# Patient Record
Sex: Female | Born: 1945 | Race: White | Hispanic: No | Marital: Married | Smoking: Never smoker
Health system: Southern US, Community
[De-identification: ages and names within clinical notes are randomized; demographics above are authoritative.]

## PROBLEM LIST (undated history)

## (undated) DIAGNOSIS — M810 Age-related osteoporosis without current pathological fracture: Secondary | ICD-10-CM

## (undated) DIAGNOSIS — R519 Headache, unspecified: Secondary | ICD-10-CM

## (undated) DIAGNOSIS — K5792 Diverticulitis of intestine, part unspecified, without perforation or abscess without bleeding: Secondary | ICD-10-CM

## (undated) DIAGNOSIS — K589 Irritable bowel syndrome without diarrhea: Secondary | ICD-10-CM

## (undated) HISTORY — PX: TONSILLECTOMY AND ADENOIDECTOMY: SUR1326

## (undated) HISTORY — PX: BREAST BIOPSY: SHX20

---

## 1998-04-27 ENCOUNTER — Other Ambulatory Visit: Admission: RE | Admit: 1998-04-27 | Discharge: 1998-04-27 | Payer: Self-pay | Admitting: Obstetrics and Gynecology

## 1999-06-07 ENCOUNTER — Encounter: Payer: Self-pay | Admitting: Obstetrics and Gynecology

## 1999-06-07 ENCOUNTER — Encounter: Admission: RE | Admit: 1999-06-07 | Discharge: 1999-06-07 | Payer: Self-pay | Admitting: Obstetrics and Gynecology

## 2000-06-26 ENCOUNTER — Encounter: Payer: Self-pay | Admitting: Family Medicine

## 2000-06-26 ENCOUNTER — Encounter: Admission: RE | Admit: 2000-06-26 | Discharge: 2000-06-26 | Payer: Self-pay | Admitting: Family Medicine

## 2001-04-02 ENCOUNTER — Encounter: Admission: RE | Admit: 2001-04-02 | Discharge: 2001-04-02 | Payer: Self-pay | Admitting: Family Medicine

## 2001-04-02 ENCOUNTER — Encounter: Payer: Self-pay | Admitting: Family Medicine

## 2001-07-01 ENCOUNTER — Encounter: Admission: RE | Admit: 2001-07-01 | Discharge: 2001-07-01 | Payer: Self-pay | Admitting: Family Medicine

## 2001-07-01 ENCOUNTER — Encounter: Payer: Self-pay | Admitting: Family Medicine

## 2003-03-17 ENCOUNTER — Encounter: Admission: RE | Admit: 2003-03-17 | Discharge: 2003-03-17 | Payer: Self-pay | Admitting: Family Medicine

## 2003-03-17 ENCOUNTER — Encounter: Payer: Self-pay | Admitting: Family Medicine

## 2004-03-21 ENCOUNTER — Ambulatory Visit (HOSPITAL_COMMUNITY): Admission: RE | Admit: 2004-03-21 | Discharge: 2004-03-21 | Payer: Self-pay | Admitting: Family Medicine

## 2004-07-26 ENCOUNTER — Other Ambulatory Visit: Admission: RE | Admit: 2004-07-26 | Discharge: 2004-07-26 | Payer: Self-pay | Admitting: Family Medicine

## 2005-04-11 ENCOUNTER — Encounter: Admission: RE | Admit: 2005-04-11 | Discharge: 2005-04-11 | Payer: Self-pay | Admitting: Family Medicine

## 2005-08-09 ENCOUNTER — Other Ambulatory Visit: Admission: RE | Admit: 2005-08-09 | Discharge: 2005-08-09 | Payer: Self-pay | Admitting: Family Medicine

## 2006-05-03 ENCOUNTER — Encounter: Admission: RE | Admit: 2006-05-03 | Discharge: 2006-05-03 | Payer: Self-pay | Admitting: Family Medicine

## 2007-02-01 ENCOUNTER — Other Ambulatory Visit: Admission: RE | Admit: 2007-02-01 | Discharge: 2007-02-01 | Payer: Self-pay | Admitting: Family Medicine

## 2007-05-09 ENCOUNTER — Encounter: Admission: RE | Admit: 2007-05-09 | Discharge: 2007-05-09 | Payer: Self-pay | Admitting: Family Medicine

## 2008-05-11 ENCOUNTER — Encounter: Admission: RE | Admit: 2008-05-11 | Discharge: 2008-05-11 | Payer: Self-pay | Admitting: Internal Medicine

## 2009-08-12 ENCOUNTER — Encounter: Admission: RE | Admit: 2009-08-12 | Discharge: 2009-08-12 | Payer: Self-pay | Admitting: Internal Medicine

## 2010-07-14 ENCOUNTER — Other Ambulatory Visit: Payer: Self-pay | Admitting: Internal Medicine

## 2010-07-14 DIAGNOSIS — Z1231 Encounter for screening mammogram for malignant neoplasm of breast: Secondary | ICD-10-CM

## 2010-08-15 ENCOUNTER — Ambulatory Visit
Admission: RE | Admit: 2010-08-15 | Discharge: 2010-08-15 | Disposition: A | Discharge status: Home / Self Care | Payer: Self-pay | Source: Ambulatory Visit | Attending: Internal Medicine | Admitting: Internal Medicine

## 2010-08-15 DIAGNOSIS — Z1231 Encounter for screening mammogram for malignant neoplasm of breast: Secondary | ICD-10-CM

## 2011-08-03 ENCOUNTER — Other Ambulatory Visit: Payer: Self-pay | Admitting: Internal Medicine

## 2011-08-03 DIAGNOSIS — Z1231 Encounter for screening mammogram for malignant neoplasm of breast: Secondary | ICD-10-CM

## 2011-08-16 ENCOUNTER — Ambulatory Visit
Admission: RE | Admit: 2011-08-16 | Discharge: 2011-08-16 | Disposition: A | Discharge status: Home / Self Care | Payer: BC Managed Care – PPO | Source: Ambulatory Visit | Attending: Internal Medicine | Admitting: Internal Medicine

## 2011-08-16 DIAGNOSIS — Z1231 Encounter for screening mammogram for malignant neoplasm of breast: Secondary | ICD-10-CM

## 2012-07-24 ENCOUNTER — Other Ambulatory Visit: Payer: Self-pay | Admitting: Internal Medicine

## 2012-08-19 ENCOUNTER — Ambulatory Visit
Admission: RE | Admit: 2012-08-19 | Discharge: 2012-08-19 | Disposition: A | Discharge status: Home / Self Care | Payer: BC Managed Care – PPO | Source: Ambulatory Visit | Attending: Internal Medicine | Admitting: Internal Medicine

## 2012-09-26 ENCOUNTER — Ambulatory Visit (INDEPENDENT_AMBULATORY_CARE_PROVIDER_SITE_OTHER): Payer: BC Managed Care – PPO | Admitting: Women's Health

## 2012-09-26 ENCOUNTER — Encounter: Payer: Self-pay | Admitting: Women's Health

## 2012-09-26 ENCOUNTER — Ambulatory Visit (INDEPENDENT_AMBULATORY_CARE_PROVIDER_SITE_OTHER): Payer: BC Managed Care – PPO

## 2012-09-26 VITALS — BP 120/76

## 2012-09-26 DIAGNOSIS — D251 Intramural leiomyoma of uterus: Secondary | ICD-10-CM

## 2012-09-26 DIAGNOSIS — Z780 Asymptomatic menopausal state: Secondary | ICD-10-CM

## 2012-09-26 DIAGNOSIS — R140 Abdominal distension (gaseous): Secondary | ICD-10-CM

## 2012-09-26 DIAGNOSIS — N959 Unspecified menopausal and perimenopausal disorder: Secondary | ICD-10-CM

## 2012-09-26 DIAGNOSIS — R142 Eructation: Secondary | ICD-10-CM

## 2012-09-26 DIAGNOSIS — R141 Gas pain: Secondary | ICD-10-CM

## 2012-09-26 DIAGNOSIS — D259 Leiomyoma of uterus, unspecified: Secondary | ICD-10-CM

## 2012-09-26 DIAGNOSIS — N898 Other specified noninflammatory disorders of vagina: Secondary | ICD-10-CM

## 2012-09-26 LAB — WET PREP FOR TRICH, YEAST, CLUE: Trich, Wet Prep: NONE SEEN

## 2012-09-26 MED ORDER — FLUCONAZOLE 150 MG PO TABS: 150.0000 mg | ORAL_TABLET | Freq: Once | ORAL | Status: DC

## 2012-09-26 NOTE — Progress Notes (Signed)
Patient ID: Julia Singleton, female   DOB: 07/11/1945, 67 y.o.   MRN: [id number] Presents with complaint of discharge and abdominal bloating discomfort.  09/09/12 noted a greenish discharge, no odor, vaginal itching or fever. Had a blood-tinged discharge on 09/14/12, has seen primary care was treated for vaginal infection with doxycycline with minimal relief. Had a negative UA x2 at primary care. Denies urinary symptoms. Continues with low abdominal discomfort with bloating, occasional discharge with green tint. Reports having GI symptoms in the past. Reports normal Paps and mammograms, done through primary care.  Exam: Appears well, no CVAT, abdomen soft no rebound or radiation of pain, external genitalia erythematous at introitus, speculum exam scant discharge vaginal atrophy noted, wet prep positive for yeast,  moderate WBCs. Bimanual no CMT or adnexal tenderness or fullness noted. Ultrasound: anteverted uterus with anterior fundal intramural fibroid 19 x 20 mm. Endometrium 1.4 mm avascular. Right and left ovary normal. Echo pattern atrophic. Negative cul-de-sac. No adnexal masses.  Yeast vaginitis Abdominal bloating with normal pelvic ultrasound  Plan: Diflucan 150 by mouth times one dose. Encouraged to review diet for possible triggers of bloating sensation. Encouraged regular daily walking. Followup with primary care if abdominal bloating/symptoms persist.

## 2012-10-09 ENCOUNTER — Telehealth: Payer: Self-pay | Admitting: *Deleted

## 2012-10-09 NOTE — Telephone Encounter (Signed)
Pt calling to follow up with OV on 09/26/12 she is still having discharge that comes and goes. More discharge in the afternoon then am. No itching or odor. Please advise

## 2012-10-09 NOTE — Telephone Encounter (Signed)
Please call, review discharge is okay, ask if any irritation, odor, itching. If not okay to watch.

## 2012-10-09 NOTE — Telephone Encounter (Signed)
Pt informed with the below note. She will follow up if needed. 

## 2013-04-29 ENCOUNTER — Emergency Department (HOSPITAL_COMMUNITY)
Admission: EM | Admit: 2013-04-29 | Discharge: 2013-04-29 | Disposition: A | Discharge status: Home / Self Care | Payer: Medicare PPO | Attending: Emergency Medicine | Admitting: Emergency Medicine

## 2013-04-29 ENCOUNTER — Encounter (HOSPITAL_COMMUNITY): Payer: Self-pay | Admitting: Emergency Medicine

## 2013-04-29 ENCOUNTER — Emergency Department (HOSPITAL_COMMUNITY): Payer: Medicare PPO

## 2013-04-29 DIAGNOSIS — R109 Unspecified abdominal pain: Secondary | ICD-10-CM

## 2013-04-29 DIAGNOSIS — R1032 Left lower quadrant pain: Secondary | ICD-10-CM | POA: Insufficient documentation

## 2013-04-29 DIAGNOSIS — R110 Nausea: Secondary | ICD-10-CM | POA: Insufficient documentation

## 2013-04-29 DIAGNOSIS — M810 Age-related osteoporosis without current pathological fracture: Secondary | ICD-10-CM | POA: Insufficient documentation

## 2013-04-29 DIAGNOSIS — Z880 Allergy status to penicillin: Secondary | ICD-10-CM | POA: Insufficient documentation

## 2013-04-29 DIAGNOSIS — K5732 Diverticulitis of large intestine without perforation or abscess without bleeding: Secondary | ICD-10-CM | POA: Insufficient documentation

## 2013-04-29 HISTORY — DX: Diverticulitis of intestine, part unspecified, without perforation or abscess without bleeding: K57.92

## 2013-04-29 HISTORY — DX: Age-related osteoporosis without current pathological fracture: M81.0

## 2013-04-29 LAB — URINE MICROSCOPIC-ADD ON

## 2013-04-29 LAB — CBC WITH DIFFERENTIAL/PLATELET
Basophils Absolute: 0 10*3/uL (ref 0.0–0.1)
Basophils Relative: 0 % (ref 0–1)
Eosinophils Absolute: 0.1 10*3/uL (ref 0.0–0.7)
HCT: 39 % (ref 36.0–46.0)
MCH: 30.7 pg (ref 26.0–34.0)
MCHC: 35.1 g/dL (ref 30.0–36.0)
MCV: 87.4 fL (ref 78.0–100.0)
Monocytes Absolute: 0.9 10*3/uL (ref 0.1–1.0)
Monocytes Relative: 8 % (ref 3–12)
Neutrophils Relative %: 75 % (ref 43–77)
Platelets: 282 10*3/uL (ref 150–400)

## 2013-04-29 LAB — URINALYSIS, ROUTINE W REFLEX MICROSCOPIC
Leukocytes, UA: NEGATIVE
Specific Gravity, Urine: 1.013 (ref 1.005–1.030)
Urobilinogen, UA: 0.2 mg/dL (ref 0.0–1.0)

## 2013-04-29 LAB — BASIC METABOLIC PANEL
CO2: 25 mEq/L (ref 19–32)
GFR calc non Af Amer: >90 mL/min (ref >90)
Glucose, Bld: 118 mg/dL — ABNORMAL HIGH (ref 70–99)
Potassium: 3.4 mEq/L — ABNORMAL LOW (ref 3.5–5.1)

## 2013-04-29 MED ORDER — IOHEXOL 300 MG/ML  SOLN
50.0000 mL | Freq: Once | INTRAMUSCULAR | Status: AC | PRN
  Administered 2013-04-29: 50 mL via ORAL

## 2013-04-29 MED ORDER — HYDROCODONE-ACETAMINOPHEN 5-325 MG PO TABS: 1.0000 | ORAL_TABLET | Freq: Four times a day (QID) | ORAL | Status: DC | PRN

## 2013-04-29 MED ORDER — CIPROFLOXACIN HCL 500 MG PO TABS: 500.0000 mg | ORAL_TABLET | Freq: Two times a day (BID) | ORAL | Status: DC

## 2013-04-29 MED ORDER — METRONIDAZOLE 500 MG PO TABS: 500.0000 mg | ORAL_TABLET | Freq: Two times a day (BID) | ORAL | Status: DC

## 2013-04-29 MED ORDER — ONDANSETRON 8 MG PO TBDP: 8.0000 mg | ORAL_TABLET | Freq: Three times a day (TID) | ORAL | Status: DC | PRN

## 2013-04-29 MED ORDER — MORPHINE SULFATE 4 MG/ML IJ SOLN: 4.0000 mg | Freq: Once | INTRAMUSCULAR | Status: DC

## 2013-04-29 MED ORDER — IOHEXOL 300 MG/ML  SOLN
100.0000 mL | Freq: Once | INTRAMUSCULAR | Status: AC | PRN
  Administered 2013-04-29: 100 mL via INTRAVENOUS

## 2013-04-29 MED ORDER — SODIUM CHLORIDE 0.9 % IV BOLUS (SEPSIS)
1000.0000 mL | Freq: Once | INTRAVENOUS | Status: AC
  Administered 2013-04-29: 1000 mL via INTRAVENOUS

## 2013-04-29 MED ORDER — ONDANSETRON HCL 4 MG/2ML IJ SOLN: 4.0000 mg | Freq: Once | INTRAMUSCULAR | Status: DC

## 2013-04-29 NOTE — ED Notes (Signed)
Pt complains of severe abd pains for one week, no vomiting or diarrhea but feels nauseated

## 2013-04-29 NOTE — ED Provider Notes (Signed)
CSN: [id number]     Arrival date & time 04/29/13  6213 History   First MD Initiated Contact with Patient 04/29/13 0247     Chief Complaint  Patient presents with  . Abdominal Pain   (Consider location/radiation/quality/duration/timing/severity/associated sxs/prior Treatment) HPI Comments: Pt comes in with cc of abd pain. Pt has hx of diverticular disease, and some obstruction. She reports that for the past 1 week, she has been having constant, crampy type pain, diffuse, with intermittent worsening. The pain is non radiating. Pain started getting more severe y'day, so she decided to come to the ER. Pt has no uti like sx, no bloody stools, no hx of kidney stones. No vaginal discharge or bleeding. Off note, pt has no ibd hx, but did say that her stools are slightly mucousy.  Patient is a 67 y.o. female presenting with abdominal pain. The history is provided by the patient.  Abdominal Pain Associated symptoms: nausea   Associated symptoms: no chest pain, no diarrhea, no dysuria, no shortness of breath and no vomiting     Past Medical History  Diagnosis Date  . Osteoporosis   . Diverticulitis    History reviewed. No pertinent past surgical history. History reviewed. No pertinent family history. History  Substance Use Topics  . Smoking status: Never Smoker   . Smokeless tobacco: Not on file  . Alcohol Use: Yes     Comment: Rare   OB History   Grav Para Term Preterm Abortions TAB SAB Ect Mult Living                 Review of Systems  Constitutional: Positive for activity change.  Respiratory: Negative for shortness of breath.   Cardiovascular: Negative for chest pain.  Gastrointestinal: Positive for nausea and abdominal pain. Negative for vomiting and diarrhea.  Genitourinary: Negative for dysuria.  Musculoskeletal: Negative for neck pain.  Neurological: Negative for headaches.    Allergies  Penicillins and Pneumococcal vaccines  Home Medications   Current Outpatient Rx   Name  Route  Sig  Dispense  Refill  . calcium carbonate (TUMS - DOSED IN MG ELEMENTAL CALCIUM) 500 MG chewable tablet   Oral   Chew 1 tablet by mouth every morning.         . cholecalciferol (VITAMIN D) 1000 UNITS tablet   Oral   Take 2,000 Units by mouth every morning.         . Multiple Vitamin (MULTIVITAMIN) tablet   Oral   Take 1 tablet by mouth every morning.          . raloxifene (EVISTA) 60 MG tablet   Oral   Take 60 mg by mouth every morning.           BP 132/69  Pulse 75  Temp(Src) 98.7 F (37.1 C) (Oral)  Resp 18  Ht 5\' 6"  (1.676 m)  Wt 125 lb (56.7 kg)  BMI 20.19 kg/m2  SpO2 98% Physical Exam  Nursing note and vitals reviewed. Constitutional: She is oriented to person, place, and time. She appears well-developed and well-nourished.  HENT:  Head: Normocephalic and atraumatic.  Eyes: EOM are normal. Pupils are equal, round, and reactive to light.  Neck: Neck supple.  Cardiovascular: Normal rate, regular rhythm and normal heart sounds.   No murmur heard. Pulmonary/Chest: Effort normal. No respiratory distress.  Abdominal: Soft. She exhibits no distension. There is tenderness. There is no rebound and no guarding.  Lower quadrant tenderness, including LLQ point tenderness  Neurological: She is  alert and oriented to person, place, and time.  Skin: Skin is warm and dry.    ED Course  Procedures (including critical care time) Labs Review Labs Reviewed  CBC WITH DIFFERENTIAL - Abnormal; Notable for the following:    WBC 11.1 (*)    Neutro Abs 8.4 (*)    All other components within normal limits  BASIC METABOLIC PANEL - Abnormal; Notable for the following:    Sodium 131 (*)    Potassium 3.4 (*)    Chloride 94 (*)    Glucose, Bld 118 (*)    All other components within normal limits  URINALYSIS, ROUTINE W REFLEX MICROSCOPIC - Abnormal; Notable for the following:    Hgb urine dipstick SMALL (*)    All other components within normal limits  URINE  MICROSCOPIC-ADD ON   Imaging Review Ct Abdomen Pelvis W Contrast  04/29/2013   CLINICAL DATA:  Severe abdominal pain and nausea for 1 week. White cell count 11.1.  EXAM: CT ABDOMEN AND PELVIS WITH CONTRAST  TECHNIQUE: Multidetector CT imaging of the abdomen and pelvis was performed using the standard protocol following bolus administration of intravenous contrast.  CONTRAST:  OMNIPAQUE IOHEXOL 300 MG/ML  SOLN  COMPARISON:  None.  FINDINGS: The lung bases are clear.  There is probably a small epiphrenic diverticulum in the lower esophagus. There is low-attenuation lesion in the anterior spleen, measuring about 6 mm diameter. This probably represents a cyst or hemangioma. The liver, gallbladder, pancreas, adrenal glands, kidneys, abdominal aorta, inferior vena cava, and retroperitoneal lymph nodes are unremarkable. The stomach and small bowel are not abnormally distended. Diffusely stool-filled colon without distention. No free fluid or free air in the abdomen.  Pelvis: The appendix is normal. Stool-filled rectosigmoid colon with multiple sigmoid diverticula. No definite inflammatory changes to suggest diverticulitis. There is a small amount of free fluid in the pelvis which is of nonspecific etiology. The uterus and ovaries are not enlarged. No significant pelvic lymphadenopathy. The bladder is decompressed without apparent wall thickening. Degenerative changes in the lumbar spine. No destructive bone lesions.  IMPRESSION: Small amount of free fluid in the pelvis is of indeterminate etiology. Inflammatory process is not excluded. Diverticulosis of the sigmoid colon without evidence of diverticulitis. The appendix is normal. The colon is diffusely stool-filled suggesting constipation.   Electronically Signed   By: Burman Nieves M.D.   On: 04/29/2013 06:18    EKG Interpretation   None       MDM  No diagnosis found. Pt comes in with cc of abd pain. Pt has hx of diverticular dz. Has mild  leukocytosis. Pt has no uti like sx. We ordered a CT - as she had LLQ abd pain - which shows non specific pelvic fluid, no diverticulitis.  I discussed the findings with the patient, and we have decided to start her on cipro and flagyl. Pt prefers that over going home with pain meds, as her sx have been going on for a week now.  i spoke with [facility] medical doctor on call, and established an appt for 9 am.  If no infection, ddx can include IBD, celiac etc.   Derwood Kaplan, MD 04/29/13 [phone]

## 2013-07-17 ENCOUNTER — Other Ambulatory Visit: Payer: Self-pay

## 2013-07-17 DIAGNOSIS — Z1231 Encounter for screening mammogram for malignant neoplasm of breast: Secondary | ICD-10-CM

## 2013-08-21 ENCOUNTER — Ambulatory Visit
Admission: RE | Admit: 2013-08-21 | Discharge: 2013-08-21 | Disposition: A | Discharge status: Home / Self Care | Payer: Commercial Managed Care - HMO | Source: Ambulatory Visit

## 2013-08-21 DIAGNOSIS — Z1231 Encounter for screening mammogram for malignant neoplasm of breast: Secondary | ICD-10-CM

## 2014-03-20 ENCOUNTER — Other Ambulatory Visit: Payer: Self-pay

## 2014-07-20 DIAGNOSIS — H9212 Otorrhea, left ear: Secondary | ICD-10-CM | POA: Diagnosis not present

## 2014-07-20 DIAGNOSIS — Z6820 Body mass index (BMI) 20.0-20.9, adult: Secondary | ICD-10-CM | POA: Diagnosis not present

## 2014-08-10 ENCOUNTER — Other Ambulatory Visit: Payer: Self-pay

## 2014-08-10 DIAGNOSIS — Z1231 Encounter for screening mammogram for malignant neoplasm of breast: Secondary | ICD-10-CM

## 2014-08-24 ENCOUNTER — Ambulatory Visit
Admission: RE | Admit: 2014-08-24 | Discharge: 2014-08-24 | Disposition: A | Discharge status: Home / Self Care | Payer: Commercial Managed Care - HMO | Source: Ambulatory Visit

## 2014-08-24 DIAGNOSIS — Z1231 Encounter for screening mammogram for malignant neoplasm of breast: Secondary | ICD-10-CM | POA: Diagnosis not present

## 2014-09-08 DIAGNOSIS — Z008 Encounter for other general examination: Secondary | ICD-10-CM | POA: Diagnosis not present

## 2014-09-08 DIAGNOSIS — M859 Disorder of bone density and structure, unspecified: Secondary | ICD-10-CM | POA: Diagnosis not present

## 2014-09-08 DIAGNOSIS — R946 Abnormal results of thyroid function studies: Secondary | ICD-10-CM | POA: Diagnosis not present

## 2014-09-08 DIAGNOSIS — R312 Other microscopic hematuria: Secondary | ICD-10-CM | POA: Diagnosis not present

## 2014-09-15 DIAGNOSIS — K5900 Constipation, unspecified: Secondary | ICD-10-CM | POA: Diagnosis not present

## 2014-09-15 DIAGNOSIS — K5790 Diverticulosis of intestine, part unspecified, without perforation or abscess without bleeding: Secondary | ICD-10-CM | POA: Diagnosis not present

## 2014-09-15 DIAGNOSIS — Z0000 Encounter for general adult medical examination without abnormal findings: Secondary | ICD-10-CM | POA: Diagnosis not present

## 2014-09-15 DIAGNOSIS — H1852 Epithelial (juvenile) corneal dystrophy: Secondary | ICD-10-CM | POA: Diagnosis not present

## 2014-09-15 DIAGNOSIS — G4700 Insomnia, unspecified: Secondary | ICD-10-CM | POA: Diagnosis not present

## 2014-09-15 DIAGNOSIS — J302 Other seasonal allergic rhinitis: Secondary | ICD-10-CM | POA: Diagnosis not present

## 2014-09-15 DIAGNOSIS — R946 Abnormal results of thyroid function studies: Secondary | ICD-10-CM | POA: Diagnosis not present

## 2014-09-15 DIAGNOSIS — F329 Major depressive disorder, single episode, unspecified: Secondary | ICD-10-CM | POA: Diagnosis not present

## 2014-10-07 DIAGNOSIS — M898X9 Other specified disorders of bone, unspecified site: Secondary | ICD-10-CM | POA: Diagnosis not present

## 2014-10-07 DIAGNOSIS — M859 Disorder of bone density and structure, unspecified: Secondary | ICD-10-CM | POA: Diagnosis not present

## 2014-10-26 DIAGNOSIS — Z1212 Encounter for screening for malignant neoplasm of rectum: Secondary | ICD-10-CM | POA: Diagnosis not present

## 2014-11-17 DIAGNOSIS — H5213 Myopia, bilateral: Secondary | ICD-10-CM | POA: Diagnosis not present

## 2014-11-17 DIAGNOSIS — H5210 Myopia, unspecified eye: Secondary | ICD-10-CM | POA: Diagnosis not present

## 2014-11-30 ENCOUNTER — Other Ambulatory Visit: Payer: Self-pay

## 2015-03-06 DIAGNOSIS — Z23 Encounter for immunization: Secondary | ICD-10-CM | POA: Diagnosis not present

## 2015-03-11 DIAGNOSIS — H9190 Unspecified hearing loss, unspecified ear: Secondary | ICD-10-CM | POA: Diagnosis not present

## 2015-03-11 DIAGNOSIS — L299 Pruritus, unspecified: Secondary | ICD-10-CM | POA: Diagnosis not present

## 2015-04-12 DIAGNOSIS — H9193 Unspecified hearing loss, bilateral: Secondary | ICD-10-CM | POA: Diagnosis not present

## 2015-04-12 DIAGNOSIS — Z974 Presence of external hearing-aid: Secondary | ICD-10-CM | POA: Diagnosis not present

## 2015-04-12 DIAGNOSIS — H608X3 Other otitis externa, bilateral: Secondary | ICD-10-CM | POA: Diagnosis not present

## 2015-07-28 ENCOUNTER — Other Ambulatory Visit: Payer: Self-pay

## 2015-07-28 DIAGNOSIS — Z1231 Encounter for screening mammogram for malignant neoplasm of breast: Secondary | ICD-10-CM

## 2015-08-12 DIAGNOSIS — Z86010 Personal history of colonic polyps: Secondary | ICD-10-CM | POA: Diagnosis not present

## 2015-08-12 DIAGNOSIS — K5730 Diverticulosis of large intestine without perforation or abscess without bleeding: Secondary | ICD-10-CM | POA: Diagnosis not present

## 2015-08-26 ENCOUNTER — Ambulatory Visit
Admission: RE | Admit: 2015-08-26 | Discharge: 2015-08-26 | Disposition: A | Discharge status: Home / Self Care | Payer: Commercial Managed Care - HMO | Source: Ambulatory Visit

## 2015-08-26 DIAGNOSIS — Z1231 Encounter for screening mammogram for malignant neoplasm of breast: Secondary | ICD-10-CM | POA: Diagnosis not present

## 2015-09-06 DIAGNOSIS — Z6820 Body mass index (BMI) 20.0-20.9, adult: Secondary | ICD-10-CM | POA: Diagnosis not present

## 2015-09-06 DIAGNOSIS — Z01411 Encounter for gynecological examination (general) (routine) with abnormal findings: Secondary | ICD-10-CM | POA: Diagnosis not present

## 2015-09-06 DIAGNOSIS — Z124 Encounter for screening for malignant neoplasm of cervix: Secondary | ICD-10-CM | POA: Diagnosis not present

## 2015-09-06 DIAGNOSIS — N9419 Other specified dyspareunia: Secondary | ICD-10-CM | POA: Diagnosis not present

## 2015-09-13 DIAGNOSIS — Z0000 Encounter for general adult medical examination without abnormal findings: Secondary | ICD-10-CM | POA: Diagnosis not present

## 2015-09-13 DIAGNOSIS — R8290 Unspecified abnormal findings in urine: Secondary | ICD-10-CM | POA: Diagnosis not present

## 2015-09-13 DIAGNOSIS — M859 Disorder of bone density and structure, unspecified: Secondary | ICD-10-CM | POA: Diagnosis not present

## 2015-09-20 DIAGNOSIS — R946 Abnormal results of thyroid function studies: Secondary | ICD-10-CM | POA: Diagnosis not present

## 2015-09-20 DIAGNOSIS — Z6820 Body mass index (BMI) 20.0-20.9, adult: Secondary | ICD-10-CM | POA: Diagnosis not present

## 2015-09-20 DIAGNOSIS — Z0000 Encounter for general adult medical examination without abnormal findings: Secondary | ICD-10-CM | POA: Diagnosis not present

## 2015-09-20 DIAGNOSIS — N951 Menopausal and female climacteric states: Secondary | ICD-10-CM | POA: Diagnosis not present

## 2015-09-20 DIAGNOSIS — Z1389 Encounter for screening for other disorder: Secondary | ICD-10-CM | POA: Diagnosis not present

## 2015-09-20 DIAGNOSIS — F329 Major depressive disorder, single episode, unspecified: Secondary | ICD-10-CM | POA: Diagnosis not present

## 2015-09-20 DIAGNOSIS — R0609 Other forms of dyspnea: Secondary | ICD-10-CM | POA: Diagnosis not present

## 2015-11-22 DIAGNOSIS — H5210 Myopia, unspecified eye: Secondary | ICD-10-CM | POA: Diagnosis not present

## 2015-11-22 DIAGNOSIS — H524 Presbyopia: Secondary | ICD-10-CM | POA: Diagnosis not present

## 2015-12-09 DIAGNOSIS — Z0100 Encounter for examination of eyes and vision without abnormal findings: Secondary | ICD-10-CM | POA: Diagnosis not present

## 2016-02-19 DIAGNOSIS — Z23 Encounter for immunization: Secondary | ICD-10-CM | POA: Diagnosis not present

## 2016-07-14 ENCOUNTER — Other Ambulatory Visit: Payer: Self-pay | Admitting: Internal Medicine

## 2016-07-14 DIAGNOSIS — Z1231 Encounter for screening mammogram for malignant neoplasm of breast: Secondary | ICD-10-CM

## 2016-08-28 ENCOUNTER — Ambulatory Visit
Admission: RE | Admit: 2016-08-28 | Discharge: 2016-08-28 | Disposition: A | Discharge status: Home / Self Care | Payer: Medicare HMO | Source: Ambulatory Visit | Attending: Internal Medicine | Admitting: Internal Medicine

## 2016-08-28 DIAGNOSIS — Z1231 Encounter for screening mammogram for malignant neoplasm of breast: Secondary | ICD-10-CM

## 2016-09-15 DIAGNOSIS — R946 Abnormal results of thyroid function studies: Secondary | ICD-10-CM | POA: Diagnosis not present

## 2016-09-15 DIAGNOSIS — R8299 Other abnormal findings in urine: Secondary | ICD-10-CM | POA: Diagnosis not present

## 2016-09-15 DIAGNOSIS — Z0000 Encounter for general adult medical examination without abnormal findings: Secondary | ICD-10-CM | POA: Diagnosis not present

## 2016-09-15 DIAGNOSIS — M859 Disorder of bone density and structure, unspecified: Secondary | ICD-10-CM | POA: Diagnosis not present

## 2016-09-21 DIAGNOSIS — Z1212 Encounter for screening for malignant neoplasm of rectum: Secondary | ICD-10-CM | POA: Diagnosis not present

## 2016-10-03 DIAGNOSIS — F341 Dysthymic disorder: Secondary | ICD-10-CM | POA: Diagnosis not present

## 2016-10-03 DIAGNOSIS — G4709 Other insomnia: Secondary | ICD-10-CM | POA: Diagnosis not present

## 2016-10-03 DIAGNOSIS — M859 Disorder of bone density and structure, unspecified: Secondary | ICD-10-CM | POA: Diagnosis not present

## 2016-10-03 DIAGNOSIS — K5909 Other constipation: Secondary | ICD-10-CM | POA: Diagnosis not present

## 2016-10-03 DIAGNOSIS — H1852 Epithelial (juvenile) corneal dystrophy: Secondary | ICD-10-CM | POA: Diagnosis not present

## 2016-10-03 DIAGNOSIS — L308 Other specified dermatitis: Secondary | ICD-10-CM | POA: Diagnosis not present

## 2016-10-03 DIAGNOSIS — J302 Other seasonal allergic rhinitis: Secondary | ICD-10-CM | POA: Diagnosis not present

## 2016-10-03 DIAGNOSIS — Z0000 Encounter for general adult medical examination without abnormal findings: Secondary | ICD-10-CM | POA: Diagnosis not present

## 2016-10-03 DIAGNOSIS — H9190 Unspecified hearing loss, unspecified ear: Secondary | ICD-10-CM | POA: Diagnosis not present

## 2016-11-02 DIAGNOSIS — M859 Disorder of bone density and structure, unspecified: Secondary | ICD-10-CM | POA: Diagnosis not present

## 2016-11-07 ENCOUNTER — Other Ambulatory Visit: Payer: Self-pay | Admitting: Internal Medicine

## 2016-11-07 DIAGNOSIS — R319 Hematuria, unspecified: Secondary | ICD-10-CM

## 2016-11-10 ENCOUNTER — Ambulatory Visit
Admission: RE | Admit: 2016-11-10 | Discharge: 2016-11-10 | Disposition: A | Discharge status: Home / Self Care | Payer: Medicare HMO | Source: Ambulatory Visit | Attending: Internal Medicine | Admitting: Internal Medicine

## 2016-11-10 DIAGNOSIS — R319 Hematuria, unspecified: Secondary | ICD-10-CM

## 2016-11-10 DIAGNOSIS — R310 Gross hematuria: Secondary | ICD-10-CM | POA: Diagnosis not present

## 2016-11-28 DIAGNOSIS — H524 Presbyopia: Secondary | ICD-10-CM | POA: Diagnosis not present

## 2017-01-08 DIAGNOSIS — R3129 Other microscopic hematuria: Secondary | ICD-10-CM | POA: Diagnosis not present

## 2017-02-19 DIAGNOSIS — R3121 Asymptomatic microscopic hematuria: Secondary | ICD-10-CM | POA: Diagnosis not present

## 2017-03-31 DIAGNOSIS — Z23 Encounter for immunization: Secondary | ICD-10-CM | POA: Diagnosis not present

## 2017-07-31 ENCOUNTER — Other Ambulatory Visit: Payer: Self-pay | Admitting: Internal Medicine

## 2017-07-31 DIAGNOSIS — Z1231 Encounter for screening mammogram for malignant neoplasm of breast: Secondary | ICD-10-CM

## 2017-09-05 ENCOUNTER — Ambulatory Visit
Admission: RE | Admit: 2017-09-05 | Discharge: 2017-09-05 | Disposition: A | Discharge status: Home / Self Care | Payer: Medicare HMO | Source: Ambulatory Visit | Attending: Internal Medicine | Admitting: Internal Medicine

## 2017-09-05 DIAGNOSIS — Z1231 Encounter for screening mammogram for malignant neoplasm of breast: Secondary | ICD-10-CM

## 2017-09-11 DIAGNOSIS — N952 Postmenopausal atrophic vaginitis: Secondary | ICD-10-CM | POA: Diagnosis not present

## 2017-09-11 DIAGNOSIS — Z01411 Encounter for gynecological examination (general) (routine) with abnormal findings: Secondary | ICD-10-CM | POA: Diagnosis not present

## 2017-09-11 DIAGNOSIS — Z124 Encounter for screening for malignant neoplasm of cervix: Secondary | ICD-10-CM | POA: Diagnosis not present

## 2017-09-11 DIAGNOSIS — N9419 Other specified dyspareunia: Secondary | ICD-10-CM | POA: Diagnosis not present

## 2017-09-11 DIAGNOSIS — Z6820 Body mass index (BMI) 20.0-20.9, adult: Secondary | ICD-10-CM | POA: Diagnosis not present

## 2017-10-04 DIAGNOSIS — R946 Abnormal results of thyroid function studies: Secondary | ICD-10-CM | POA: Diagnosis not present

## 2017-10-04 DIAGNOSIS — Z0000 Encounter for general adult medical examination without abnormal findings: Secondary | ICD-10-CM | POA: Diagnosis not present

## 2017-10-04 DIAGNOSIS — M859 Disorder of bone density and structure, unspecified: Secondary | ICD-10-CM | POA: Diagnosis not present

## 2017-10-04 DIAGNOSIS — R82998 Other abnormal findings in urine: Secondary | ICD-10-CM | POA: Diagnosis not present

## 2017-10-11 DIAGNOSIS — F341 Dysthymic disorder: Secondary | ICD-10-CM | POA: Diagnosis not present

## 2017-10-11 DIAGNOSIS — H1852 Epithelial (juvenile) corneal dystrophy: Secondary | ICD-10-CM | POA: Diagnosis not present

## 2017-10-11 DIAGNOSIS — M810 Age-related osteoporosis without current pathological fracture: Secondary | ICD-10-CM | POA: Diagnosis not present

## 2017-10-11 DIAGNOSIS — Z0000 Encounter for general adult medical examination without abnormal findings: Secondary | ICD-10-CM | POA: Diagnosis not present

## 2017-10-11 DIAGNOSIS — G4709 Other insomnia: Secondary | ICD-10-CM | POA: Diagnosis not present

## 2017-10-11 DIAGNOSIS — K30 Functional dyspepsia: Secondary | ICD-10-CM | POA: Diagnosis not present

## 2017-10-11 DIAGNOSIS — R319 Hematuria, unspecified: Secondary | ICD-10-CM | POA: Diagnosis not present

## 2017-10-11 DIAGNOSIS — K5909 Other constipation: Secondary | ICD-10-CM | POA: Diagnosis not present

## 2017-10-11 DIAGNOSIS — R918 Other nonspecific abnormal finding of lung field: Secondary | ICD-10-CM | POA: Diagnosis not present

## 2017-10-12 ENCOUNTER — Other Ambulatory Visit: Payer: Self-pay | Admitting: Internal Medicine

## 2017-10-12 DIAGNOSIS — R911 Solitary pulmonary nodule: Secondary | ICD-10-CM

## 2017-10-17 ENCOUNTER — Ambulatory Visit
Admission: RE | Admit: 2017-10-17 | Discharge: 2017-10-17 | Disposition: A | Discharge status: Home / Self Care | Payer: Medicare HMO | Source: Ambulatory Visit | Attending: Internal Medicine | Admitting: Internal Medicine

## 2017-10-17 DIAGNOSIS — R911 Solitary pulmonary nodule: Secondary | ICD-10-CM

## 2017-10-30 DIAGNOSIS — Z1212 Encounter for screening for malignant neoplasm of rectum: Secondary | ICD-10-CM | POA: Diagnosis not present

## 2017-11-27 DIAGNOSIS — H5213 Myopia, bilateral: Secondary | ICD-10-CM | POA: Diagnosis not present

## 2018-03-09 DIAGNOSIS — Z23 Encounter for immunization: Secondary | ICD-10-CM | POA: Diagnosis not present

## 2018-07-08 DIAGNOSIS — R05 Cough: Secondary | ICD-10-CM | POA: Diagnosis not present

## 2018-07-08 DIAGNOSIS — J029 Acute pharyngitis, unspecified: Secondary | ICD-10-CM | POA: Diagnosis not present

## 2018-07-08 DIAGNOSIS — J302 Other seasonal allergic rhinitis: Secondary | ICD-10-CM | POA: Diagnosis not present

## 2018-07-08 DIAGNOSIS — Z6820 Body mass index (BMI) 20.0-20.9, adult: Secondary | ICD-10-CM | POA: Diagnosis not present

## 2018-07-08 DIAGNOSIS — K219 Gastro-esophageal reflux disease without esophagitis: Secondary | ICD-10-CM | POA: Diagnosis not present

## 2018-08-13 ENCOUNTER — Other Ambulatory Visit: Payer: Self-pay | Admitting: Internal Medicine

## 2018-08-13 DIAGNOSIS — Z1231 Encounter for screening mammogram for malignant neoplasm of breast: Secondary | ICD-10-CM

## 2018-08-16 ENCOUNTER — Ambulatory Visit (HOSPITAL_BASED_OUTPATIENT_CLINIC_OR_DEPARTMENT_OTHER)
Admission: RE | Admit: 2018-08-16 | Discharge: 2018-08-16 | Disposition: A | Discharge status: Home / Self Care | Payer: Medicare HMO | Source: Ambulatory Visit | Attending: Internal Medicine | Admitting: Internal Medicine

## 2018-08-16 ENCOUNTER — Other Ambulatory Visit: Payer: Self-pay | Admitting: Internal Medicine

## 2018-08-16 ENCOUNTER — Other Ambulatory Visit: Payer: Self-pay

## 2018-08-16 ENCOUNTER — Other Ambulatory Visit (HOSPITAL_COMMUNITY): Payer: Self-pay | Admitting: Internal Medicine

## 2018-08-16 DIAGNOSIS — D164 Benign neoplasm of bones of skull and face: Secondary | ICD-10-CM | POA: Diagnosis not present

## 2018-08-16 DIAGNOSIS — R05 Cough: Secondary | ICD-10-CM | POA: Insufficient documentation

## 2018-08-16 DIAGNOSIS — J029 Acute pharyngitis, unspecified: Secondary | ICD-10-CM | POA: Diagnosis not present

## 2018-08-16 DIAGNOSIS — D1039 Benign neoplasm of other parts of mouth: Secondary | ICD-10-CM | POA: Diagnosis not present

## 2018-08-16 DIAGNOSIS — R059 Cough, unspecified: Secondary | ICD-10-CM

## 2018-08-16 DIAGNOSIS — K219 Gastro-esophageal reflux disease without esophagitis: Secondary | ICD-10-CM | POA: Diagnosis not present

## 2018-08-16 DIAGNOSIS — Z1331 Encounter for screening for depression: Secondary | ICD-10-CM | POA: Diagnosis not present

## 2018-08-16 DIAGNOSIS — K30 Functional dyspepsia: Secondary | ICD-10-CM | POA: Diagnosis not present

## 2018-09-09 ENCOUNTER — Ambulatory Visit: Payer: Medicare HMO

## 2019-01-28 DIAGNOSIS — R7989 Other specified abnormal findings of blood chemistry: Secondary | ICD-10-CM | POA: Diagnosis not present

## 2019-01-28 DIAGNOSIS — Z23 Encounter for immunization: Secondary | ICD-10-CM | POA: Diagnosis not present

## 2019-01-28 DIAGNOSIS — M810 Age-related osteoporosis without current pathological fracture: Secondary | ICD-10-CM | POA: Diagnosis not present

## 2019-01-29 DIAGNOSIS — R82998 Other abnormal findings in urine: Secondary | ICD-10-CM | POA: Diagnosis not present

## 2019-02-04 DIAGNOSIS — D3500 Benign neoplasm of unspecified adrenal gland: Secondary | ICD-10-CM | POA: Diagnosis not present

## 2019-02-04 DIAGNOSIS — E042 Nontoxic multinodular goiter: Secondary | ICD-10-CM | POA: Diagnosis not present

## 2019-02-04 DIAGNOSIS — R918 Other nonspecific abnormal finding of lung field: Secondary | ICD-10-CM | POA: Diagnosis not present

## 2019-02-04 DIAGNOSIS — Z1331 Encounter for screening for depression: Secondary | ICD-10-CM | POA: Diagnosis not present

## 2019-02-04 DIAGNOSIS — Z0000 Encounter for general adult medical examination without abnormal findings: Secondary | ICD-10-CM | POA: Diagnosis not present

## 2019-02-04 DIAGNOSIS — Z1339 Encounter for screening examination for other mental health and behavioral disorders: Secondary | ICD-10-CM | POA: Diagnosis not present

## 2019-02-04 DIAGNOSIS — R05 Cough: Secondary | ICD-10-CM | POA: Diagnosis not present

## 2019-02-04 DIAGNOSIS — K5900 Constipation, unspecified: Secondary | ICD-10-CM | POA: Diagnosis not present

## 2019-02-04 DIAGNOSIS — I700 Atherosclerosis of aorta: Secondary | ICD-10-CM | POA: Diagnosis not present

## 2019-02-04 DIAGNOSIS — J029 Acute pharyngitis, unspecified: Secondary | ICD-10-CM | POA: Diagnosis not present

## 2019-02-04 DIAGNOSIS — D1039 Benign neoplasm of other parts of mouth: Secondary | ICD-10-CM | POA: Diagnosis not present

## 2019-07-05 ENCOUNTER — Ambulatory Visit: Payer: Medicare HMO

## 2019-07-10 ENCOUNTER — Ambulatory Visit: Payer: Medicare HMO | Attending: Internal Medicine

## 2019-07-10 DIAGNOSIS — Z23 Encounter for immunization: Secondary | ICD-10-CM | POA: Insufficient documentation

## 2019-07-10 NOTE — Progress Notes (Signed)
   Covid-19 Vaccination Clinic  Name:  Julia Singleton    MRN: [id number] DOB: March 19, 1946  07/10/2019  [name] was observed post Covid-19 immunization for 15 minutes without incidence. She was provided with Vaccine Information Sheet and instruction to access the V-Safe system.   [name] was instructed to call 911 with any severe reactions post vaccine: Marland Kitchen Difficulty breathing  . Swelling of your face and throat  . A fast heartbeat  . A bad rash all over your body  . Dizziness and weakness    Immunizations Administered    Name Date Dose VIS Date Route   Pfizer COVID-19 Vaccine 07/10/2019 10:36 AM 0.3 mL 05/16/2019 Intramuscular   Manufacturer: [place]   Lot: [id number]   [place]: [id number]

## 2019-07-16 ENCOUNTER — Ambulatory Visit: Payer: Medicare HMO

## 2019-08-04 ENCOUNTER — Ambulatory Visit: Payer: Medicare HMO | Attending: Internal Medicine

## 2019-08-04 DIAGNOSIS — Z23 Encounter for immunization: Secondary | ICD-10-CM | POA: Insufficient documentation

## 2019-08-04 NOTE — Progress Notes (Signed)
   Covid-19 Vaccination Clinic  Name:  NATAIYA CZARNOWSKI    MRN: [id number] DOB: Aug 24, 1945  08/04/2019  Ms. Farabee was observed post Covid-19 immunization for 15 minutes without incidence. She was provided with Vaccine Information Sheet and instruction to access the V-Safe system.   Ms. Wyland was instructed to call 911 with any severe reactions post vaccine: Marland Kitchen Difficulty breathing  . Swelling of your face and throat  . A fast heartbeat  . A bad rash all over your body  . Dizziness and weakness    Immunizations Administered    Name Date Dose VIS Date Route   Pfizer COVID-19 Vaccine 08/04/2019  3:06 PM 0.3 mL 05/16/2019 Intramuscular   Manufacturer: [place]   Lot: [id number]   [place]: [id number]

## 2019-08-19 DIAGNOSIS — M25511 Pain in right shoulder: Secondary | ICD-10-CM | POA: Diagnosis not present

## 2019-08-19 DIAGNOSIS — R3989 Other symptoms and signs involving the genitourinary system: Secondary | ICD-10-CM | POA: Diagnosis not present

## 2019-08-25 DIAGNOSIS — M810 Age-related osteoporosis without current pathological fracture: Secondary | ICD-10-CM | POA: Diagnosis not present

## 2019-08-27 DIAGNOSIS — M25511 Pain in right shoulder: Secondary | ICD-10-CM | POA: Diagnosis not present

## 2019-08-27 DIAGNOSIS — M7541 Impingement syndrome of right shoulder: Secondary | ICD-10-CM | POA: Diagnosis not present

## 2019-08-28 ENCOUNTER — Other Ambulatory Visit: Payer: Self-pay | Admitting: Internal Medicine

## 2019-08-28 DIAGNOSIS — R3989 Other symptoms and signs involving the genitourinary system: Secondary | ICD-10-CM | POA: Diagnosis not present

## 2019-08-28 DIAGNOSIS — N390 Urinary tract infection, site not specified: Secondary | ICD-10-CM | POA: Diagnosis not present

## 2019-08-28 DIAGNOSIS — Z1231 Encounter for screening mammogram for malignant neoplasm of breast: Secondary | ICD-10-CM

## 2019-09-03 DIAGNOSIS — H903 Sensorineural hearing loss, bilateral: Secondary | ICD-10-CM | POA: Diagnosis not present

## 2019-09-04 ENCOUNTER — Other Ambulatory Visit: Payer: Self-pay

## 2019-09-04 ENCOUNTER — Ambulatory Visit
Admission: RE | Admit: 2019-09-04 | Discharge: 2019-09-04 | Disposition: A | Discharge status: Home / Self Care | Payer: Medicare HMO | Source: Ambulatory Visit

## 2019-09-04 DIAGNOSIS — Z1231 Encounter for screening mammogram for malignant neoplasm of breast: Secondary | ICD-10-CM

## 2019-09-11 DIAGNOSIS — H5213 Myopia, bilateral: Secondary | ICD-10-CM | POA: Diagnosis not present

## 2019-09-15 DIAGNOSIS — E042 Nontoxic multinodular goiter: Secondary | ICD-10-CM | POA: Diagnosis not present

## 2019-09-15 DIAGNOSIS — I700 Atherosclerosis of aorta: Secondary | ICD-10-CM | POA: Diagnosis not present

## 2019-09-15 DIAGNOSIS — R82998 Other abnormal findings in urine: Secondary | ICD-10-CM | POA: Diagnosis not present

## 2019-09-15 DIAGNOSIS — K5790 Diverticulosis of intestine, part unspecified, without perforation or abscess without bleeding: Secondary | ICD-10-CM | POA: Diagnosis not present

## 2019-09-15 DIAGNOSIS — R3989 Other symptoms and signs involving the genitourinary system: Secondary | ICD-10-CM | POA: Diagnosis not present

## 2019-09-15 DIAGNOSIS — R209 Unspecified disturbances of skin sensation: Secondary | ICD-10-CM | POA: Diagnosis not present

## 2019-09-15 DIAGNOSIS — M25511 Pain in right shoulder: Secondary | ICD-10-CM | POA: Diagnosis not present

## 2019-09-16 DIAGNOSIS — R197 Diarrhea, unspecified: Secondary | ICD-10-CM | POA: Diagnosis not present

## 2019-09-16 DIAGNOSIS — R300 Dysuria: Secondary | ICD-10-CM | POA: Diagnosis not present

## 2019-09-16 DIAGNOSIS — Z681 Body mass index (BMI) 19 or less, adult: Secondary | ICD-10-CM | POA: Diagnosis not present

## 2019-09-16 DIAGNOSIS — Z01411 Encounter for gynecological examination (general) (routine) with abnormal findings: Secondary | ICD-10-CM | POA: Diagnosis not present

## 2019-09-16 DIAGNOSIS — R3121 Asymptomatic microscopic hematuria: Secondary | ICD-10-CM | POA: Diagnosis not present

## 2019-09-16 DIAGNOSIS — N952 Postmenopausal atrophic vaginitis: Secondary | ICD-10-CM | POA: Diagnosis not present

## 2019-09-16 DIAGNOSIS — R102 Pelvic and perineal pain: Secondary | ICD-10-CM | POA: Diagnosis not present

## 2019-09-16 DIAGNOSIS — M8580 Other specified disorders of bone density and structure, unspecified site: Secondary | ICD-10-CM | POA: Diagnosis not present

## 2019-09-16 DIAGNOSIS — Z124 Encounter for screening for malignant neoplasm of cervix: Secondary | ICD-10-CM | POA: Diagnosis not present

## 2019-09-18 DIAGNOSIS — Z0000 Encounter for general adult medical examination without abnormal findings: Secondary | ICD-10-CM | POA: Diagnosis not present

## 2019-10-21 DIAGNOSIS — R300 Dysuria: Secondary | ICD-10-CM | POA: Diagnosis not present

## 2019-10-21 DIAGNOSIS — R3121 Asymptomatic microscopic hematuria: Secondary | ICD-10-CM | POA: Diagnosis not present

## 2019-10-21 DIAGNOSIS — N952 Postmenopausal atrophic vaginitis: Secondary | ICD-10-CM | POA: Diagnosis not present

## 2019-10-30 ENCOUNTER — Other Ambulatory Visit: Payer: Self-pay | Admitting: Obstetrics & Gynecology

## 2019-10-30 DIAGNOSIS — R102 Pelvic and perineal pain: Secondary | ICD-10-CM

## 2019-11-12 ENCOUNTER — Ambulatory Visit
Admission: RE | Admit: 2019-11-12 | Discharge: 2019-11-12 | Disposition: A | Discharge status: Home / Self Care | Payer: Medicare HMO | Source: Ambulatory Visit | Attending: Obstetrics & Gynecology | Admitting: Obstetrics & Gynecology

## 2019-11-12 DIAGNOSIS — R102 Pelvic and perineal pain: Secondary | ICD-10-CM

## 2019-11-12 DIAGNOSIS — D251 Intramural leiomyoma of uterus: Secondary | ICD-10-CM | POA: Diagnosis not present

## 2019-11-13 ENCOUNTER — Other Ambulatory Visit: Payer: Self-pay | Admitting: Obstetrics & Gynecology

## 2019-11-14 DIAGNOSIS — N952 Postmenopausal atrophic vaginitis: Secondary | ICD-10-CM | POA: Diagnosis not present

## 2019-11-14 DIAGNOSIS — R102 Pelvic and perineal pain: Secondary | ICD-10-CM | POA: Diagnosis not present

## 2019-11-14 DIAGNOSIS — D251 Intramural leiomyoma of uterus: Secondary | ICD-10-CM | POA: Diagnosis not present

## 2020-01-26 DIAGNOSIS — M859 Disorder of bone density and structure, unspecified: Secondary | ICD-10-CM | POA: Diagnosis not present

## 2020-01-26 DIAGNOSIS — M8580 Other specified disorders of bone density and structure, unspecified site: Secondary | ICD-10-CM | POA: Diagnosis not present

## 2020-01-26 DIAGNOSIS — E042 Nontoxic multinodular goiter: Secondary | ICD-10-CM | POA: Diagnosis not present

## 2020-01-26 DIAGNOSIS — I700 Atherosclerosis of aorta: Secondary | ICD-10-CM | POA: Diagnosis not present

## 2020-01-30 DIAGNOSIS — I700 Atherosclerosis of aorta: Secondary | ICD-10-CM | POA: Diagnosis not present

## 2020-01-30 DIAGNOSIS — R82998 Other abnormal findings in urine: Secondary | ICD-10-CM | POA: Diagnosis not present

## 2020-01-30 DIAGNOSIS — Z1212 Encounter for screening for malignant neoplasm of rectum: Secondary | ICD-10-CM | POA: Diagnosis not present

## 2020-02-05 DIAGNOSIS — E042 Nontoxic multinodular goiter: Secondary | ICD-10-CM | POA: Diagnosis not present

## 2020-02-05 DIAGNOSIS — Z0000 Encounter for general adult medical examination without abnormal findings: Secondary | ICD-10-CM | POA: Diagnosis not present

## 2020-02-05 DIAGNOSIS — M810 Age-related osteoporosis without current pathological fracture: Secondary | ICD-10-CM | POA: Diagnosis not present

## 2020-02-05 DIAGNOSIS — F341 Dysthymic disorder: Secondary | ICD-10-CM | POA: Diagnosis not present

## 2020-02-05 DIAGNOSIS — R3121 Asymptomatic microscopic hematuria: Secondary | ICD-10-CM | POA: Diagnosis not present

## 2020-02-05 DIAGNOSIS — F329 Major depressive disorder, single episode, unspecified: Secondary | ICD-10-CM | POA: Diagnosis not present

## 2020-02-05 DIAGNOSIS — I700 Atherosclerosis of aorta: Secondary | ICD-10-CM | POA: Diagnosis not present

## 2020-02-05 DIAGNOSIS — R918 Other nonspecific abnormal finding of lung field: Secondary | ICD-10-CM | POA: Diagnosis not present

## 2020-02-05 DIAGNOSIS — R946 Abnormal results of thyroid function studies: Secondary | ICD-10-CM | POA: Diagnosis not present

## 2020-03-09 ENCOUNTER — Ambulatory Visit: Payer: Medicare HMO | Attending: Internal Medicine

## 2020-03-09 DIAGNOSIS — Z23 Encounter for immunization: Secondary | ICD-10-CM

## 2020-03-09 NOTE — Progress Notes (Signed)
   Covid-19 Vaccination Clinic  Name:  Julia Singleton    MRN: [id number] DOB: [birth year]  03/09/2020  [name] was observed post Covid-19 immunization for 15 minutes without incident. She was provided with Vaccine Information Sheet and instruction to access the V-Safe system.   [name] was instructed to call 911 with any severe reactions post vaccine: Marland Kitchen Difficulty breathing  . Swelling of face and throat  . A fast heartbeat  . A bad rash all over body  . Dizziness and weakness

## 2020-03-17 DIAGNOSIS — Z23 Encounter for immunization: Secondary | ICD-10-CM | POA: Diagnosis not present

## 2020-08-02 ENCOUNTER — Other Ambulatory Visit: Payer: Self-pay | Admitting: Internal Medicine

## 2020-08-02 DIAGNOSIS — Z1231 Encounter for screening mammogram for malignant neoplasm of breast: Secondary | ICD-10-CM

## 2020-09-08 ENCOUNTER — Ambulatory Visit
Admission: RE | Admit: 2020-09-08 | Discharge: 2020-09-08 | Disposition: A | Discharge status: Home / Self Care | Payer: Medicare HMO | Source: Ambulatory Visit

## 2020-09-08 ENCOUNTER — Other Ambulatory Visit: Payer: Self-pay

## 2020-09-08 DIAGNOSIS — Z1231 Encounter for screening mammogram for malignant neoplasm of breast: Secondary | ICD-10-CM | POA: Diagnosis not present

## 2020-09-09 DIAGNOSIS — K580 Irritable bowel syndrome with diarrhea: Secondary | ICD-10-CM | POA: Diagnosis not present

## 2020-09-09 DIAGNOSIS — Z86010 Personal history of colonic polyps: Secondary | ICD-10-CM | POA: Diagnosis not present

## 2020-09-09 DIAGNOSIS — R140 Abdominal distension (gaseous): Secondary | ICD-10-CM | POA: Diagnosis not present

## 2020-09-09 DIAGNOSIS — R109 Unspecified abdominal pain: Secondary | ICD-10-CM | POA: Diagnosis not present

## 2020-09-21 ENCOUNTER — Other Ambulatory Visit (HOSPITAL_BASED_OUTPATIENT_CLINIC_OR_DEPARTMENT_OTHER): Payer: Self-pay

## 2020-09-22 ENCOUNTER — Other Ambulatory Visit (HOSPITAL_BASED_OUTPATIENT_CLINIC_OR_DEPARTMENT_OTHER): Payer: Self-pay

## 2020-09-22 ENCOUNTER — Other Ambulatory Visit: Payer: Self-pay

## 2020-09-22 ENCOUNTER — Ambulatory Visit: Payer: Medicare HMO | Attending: Internal Medicine

## 2020-09-22 DIAGNOSIS — Z23 Encounter for immunization: Secondary | ICD-10-CM

## 2020-09-22 MED ORDER — COVID-19 MRNA VACC (MODERNA) 100 MCG/0.5ML IM SUSP
INTRAMUSCULAR | 0 refills | Status: DC
  Filled 2020-09-22: qty 0.25, 1d supply, fill #0

## 2020-09-22 NOTE — Progress Notes (Signed)
   Covid-19 Vaccination Clinic  Name:  CHANTEE CERINO    MRN: [id number] DOB: [birth year]  09/22/2020  Ms. Belvedere was observed post Covid-19 immunization for 15 minutes without incident. She was provided with Vaccine Information Sheet and instruction to access the V-Safe system.   Ms. Hoobler was instructed to call 911 with any severe reactions post vaccine: Marland Kitchen Difficulty breathing  . Swelling of face and throat  . A fast heartbeat  . A bad rash all over body  . Dizziness and weakness   Immunizations Administered    Name Date Dose VIS Date Route   Moderna Covid-19 Booster Vaccine 09/22/2020  1:16 PM 0.25 mL 03/24/2020 Intramuscular   Manufacturer: Moderna   Lot: [id number]   [place]: [phone]

## 2020-10-05 DIAGNOSIS — K589 Irritable bowel syndrome without diarrhea: Secondary | ICD-10-CM | POA: Diagnosis not present

## 2020-10-05 DIAGNOSIS — Z86010 Personal history of colonic polyps: Secondary | ICD-10-CM | POA: Diagnosis not present

## 2020-10-05 DIAGNOSIS — Z1211 Encounter for screening for malignant neoplasm of colon: Secondary | ICD-10-CM | POA: Diagnosis not present

## 2020-10-05 DIAGNOSIS — R140 Abdominal distension (gaseous): Secondary | ICD-10-CM | POA: Diagnosis not present

## 2020-10-11 DIAGNOSIS — D2271 Melanocytic nevi of right lower limb, including hip: Secondary | ICD-10-CM | POA: Diagnosis not present

## 2020-10-11 DIAGNOSIS — L821 Other seborrheic keratosis: Secondary | ICD-10-CM | POA: Diagnosis not present

## 2020-10-11 DIAGNOSIS — L438 Other lichen planus: Secondary | ICD-10-CM | POA: Diagnosis not present

## 2020-10-11 DIAGNOSIS — L814 Other melanin hyperpigmentation: Secondary | ICD-10-CM | POA: Diagnosis not present

## 2020-10-11 DIAGNOSIS — D1801 Hemangioma of skin and subcutaneous tissue: Secondary | ICD-10-CM | POA: Diagnosis not present

## 2020-10-11 DIAGNOSIS — D2262 Melanocytic nevi of left upper limb, including shoulder: Secondary | ICD-10-CM | POA: Diagnosis not present

## 2020-10-11 DIAGNOSIS — D225 Melanocytic nevi of trunk: Secondary | ICD-10-CM | POA: Diagnosis not present

## 2020-10-11 DIAGNOSIS — D2272 Melanocytic nevi of left lower limb, including hip: Secondary | ICD-10-CM | POA: Diagnosis not present

## 2020-10-11 DIAGNOSIS — D485 Neoplasm of uncertain behavior of skin: Secondary | ICD-10-CM | POA: Diagnosis not present

## 2020-10-12 DIAGNOSIS — Z1212 Encounter for screening for malignant neoplasm of rectum: Secondary | ICD-10-CM | POA: Diagnosis not present

## 2020-10-12 DIAGNOSIS — Z1211 Encounter for screening for malignant neoplasm of colon: Secondary | ICD-10-CM | POA: Diagnosis not present

## 2020-12-21 DIAGNOSIS — H5210 Myopia, unspecified eye: Secondary | ICD-10-CM | POA: Diagnosis not present

## 2021-01-17 DIAGNOSIS — M25512 Pain in left shoulder: Secondary | ICD-10-CM | POA: Diagnosis not present

## 2021-02-11 DIAGNOSIS — M859 Disorder of bone density and structure, unspecified: Secondary | ICD-10-CM | POA: Diagnosis not present

## 2021-02-11 DIAGNOSIS — E042 Nontoxic multinodular goiter: Secondary | ICD-10-CM | POA: Diagnosis not present

## 2021-02-11 DIAGNOSIS — I700 Atherosclerosis of aorta: Secondary | ICD-10-CM | POA: Diagnosis not present

## 2021-02-18 DIAGNOSIS — E871 Hypo-osmolality and hyponatremia: Secondary | ICD-10-CM | POA: Diagnosis not present

## 2021-02-18 DIAGNOSIS — Z0000 Encounter for general adult medical examination without abnormal findings: Secondary | ICD-10-CM | POA: Diagnosis not present

## 2021-02-18 DIAGNOSIS — I700 Atherosclerosis of aorta: Secondary | ICD-10-CM | POA: Diagnosis not present

## 2021-02-18 DIAGNOSIS — M810 Age-related osteoporosis without current pathological fracture: Secondary | ICD-10-CM | POA: Diagnosis not present

## 2021-02-18 DIAGNOSIS — E042 Nontoxic multinodular goiter: Secondary | ICD-10-CM | POA: Diagnosis not present

## 2021-02-18 DIAGNOSIS — K5900 Constipation, unspecified: Secondary | ICD-10-CM | POA: Diagnosis not present

## 2021-02-18 DIAGNOSIS — G4700 Insomnia, unspecified: Secondary | ICD-10-CM | POA: Diagnosis not present

## 2021-02-18 DIAGNOSIS — F341 Dysthymic disorder: Secondary | ICD-10-CM | POA: Diagnosis not present

## 2021-02-18 DIAGNOSIS — Z1331 Encounter for screening for depression: Secondary | ICD-10-CM | POA: Diagnosis not present

## 2021-02-18 DIAGNOSIS — Z23 Encounter for immunization: Secondary | ICD-10-CM | POA: Diagnosis not present

## 2021-02-18 DIAGNOSIS — K6389 Other specified diseases of intestine: Secondary | ICD-10-CM | POA: Diagnosis not present

## 2021-02-18 DIAGNOSIS — R946 Abnormal results of thyroid function studies: Secondary | ICD-10-CM | POA: Diagnosis not present

## 2021-02-18 DIAGNOSIS — Z1389 Encounter for screening for other disorder: Secondary | ICD-10-CM | POA: Diagnosis not present

## 2021-02-22 DIAGNOSIS — H2513 Age-related nuclear cataract, bilateral: Secondary | ICD-10-CM | POA: Diagnosis not present

## 2021-02-22 DIAGNOSIS — H2512 Age-related nuclear cataract, left eye: Secondary | ICD-10-CM | POA: Diagnosis not present

## 2021-02-22 DIAGNOSIS — H02834 Dermatochalasis of left upper eyelid: Secondary | ICD-10-CM | POA: Diagnosis not present

## 2021-02-22 DIAGNOSIS — H18413 Arcus senilis, bilateral: Secondary | ICD-10-CM | POA: Diagnosis not present

## 2021-02-22 DIAGNOSIS — H02831 Dermatochalasis of right upper eyelid: Secondary | ICD-10-CM | POA: Diagnosis not present

## 2021-03-11 ENCOUNTER — Other Ambulatory Visit: Payer: Self-pay

## 2021-03-11 ENCOUNTER — Other Ambulatory Visit (HOSPITAL_BASED_OUTPATIENT_CLINIC_OR_DEPARTMENT_OTHER): Payer: Self-pay

## 2021-03-11 ENCOUNTER — Ambulatory Visit: Payer: Medicare HMO | Attending: Internal Medicine

## 2021-03-11 DIAGNOSIS — Z23 Encounter for immunization: Secondary | ICD-10-CM

## 2021-03-11 MED ORDER — MODERNA COVID-19 BIVAL BOOSTER 50 MCG/0.5ML IM SUSP
INTRAMUSCULAR | 0 refills | Status: DC
  Filled 2021-03-11: qty 0.5, 1d supply, fill #0

## 2021-03-11 NOTE — Progress Notes (Signed)
   Covid-19 Vaccination Clinic  Name:  ARNESHA SCHIRALDI    MRN: [id number] DOB: 1946/03/29  03/11/2021  Ms. Gruszka was observed post Covid-19 immunization for 15 minutes without incident. She was provided with Vaccine Information Sheet and instruction to access the V-Safe system.   Ms. Ute[place] was instructed to call 911 with any severe reactions post vaccine: Difficulty breathing  Swelling of face and throat  A fast heartbeat  A bad rash all over body  Dizziness and weakness

## 2021-03-21 DIAGNOSIS — E871 Hypo-osmolality and hyponatremia: Secondary | ICD-10-CM | POA: Diagnosis not present

## 2021-03-21 DIAGNOSIS — E042 Nontoxic multinodular goiter: Secondary | ICD-10-CM | POA: Diagnosis not present

## 2021-03-21 DIAGNOSIS — I700 Atherosclerosis of aorta: Secondary | ICD-10-CM | POA: Diagnosis not present

## 2021-04-17 IMAGING — MG DIGITAL SCREENING BILAT W/ TOMO W/ CAD
8 series · 9 of 24 positions shown · non-contrast
Comparison: Previous exam(s).

CLINICAL DATA: Screening.

EXAM:
DIGITAL SCREENING BILATERAL MAMMOGRAM WITH TOMO AND CAD

[L MLO synth-2D]
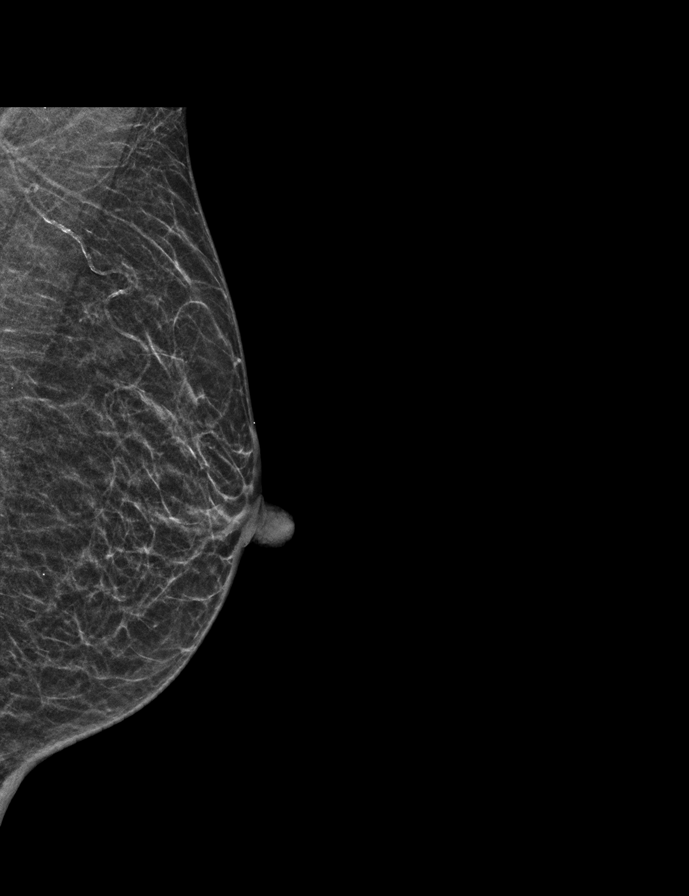

[R CC synth-2D]
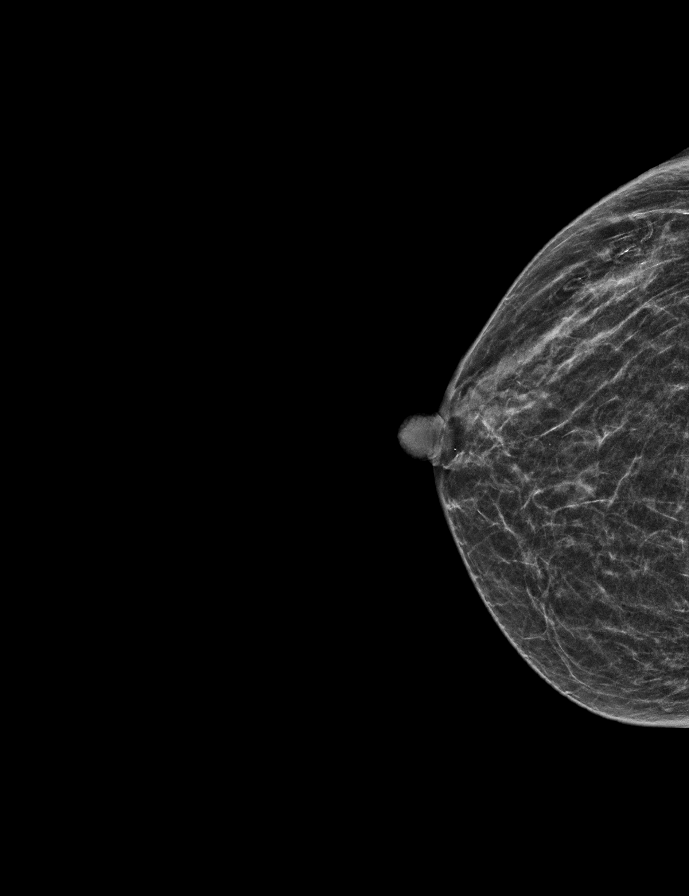

[R MLO synth-2D]
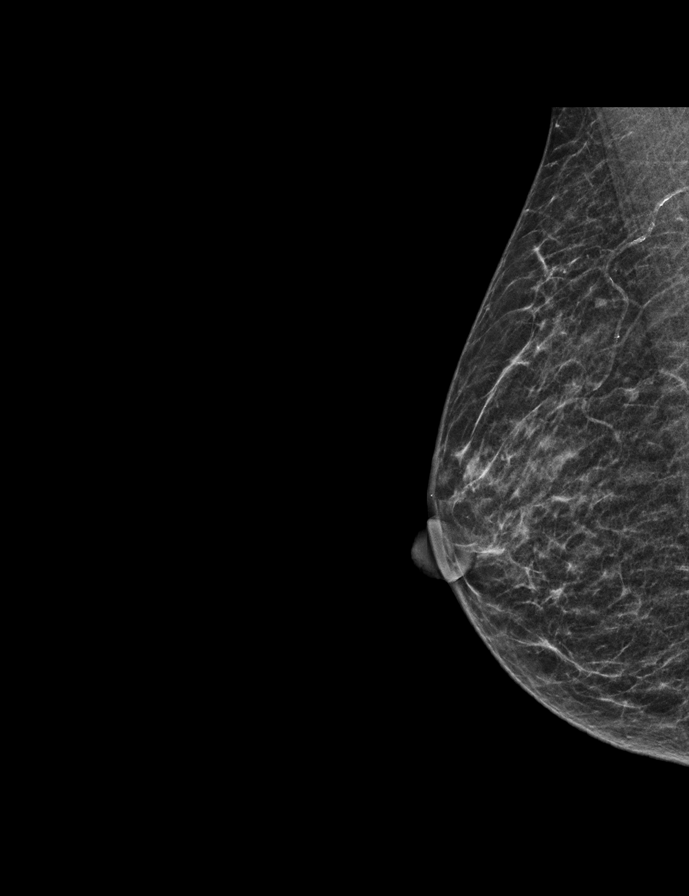

[L CC synth-2D]
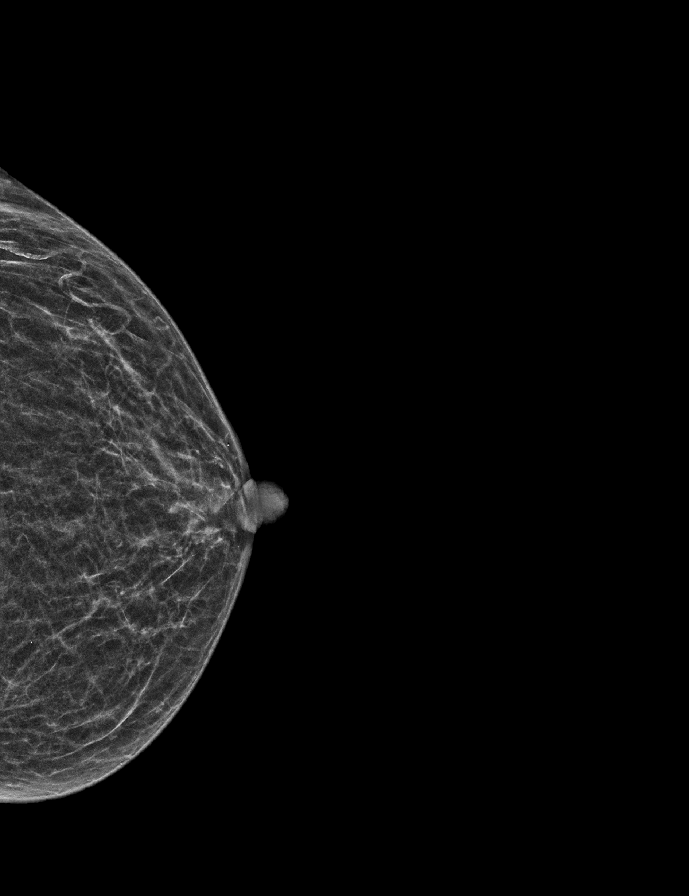

[R CC tomo · 2 of 34 frames shown]
[frame 12/34]
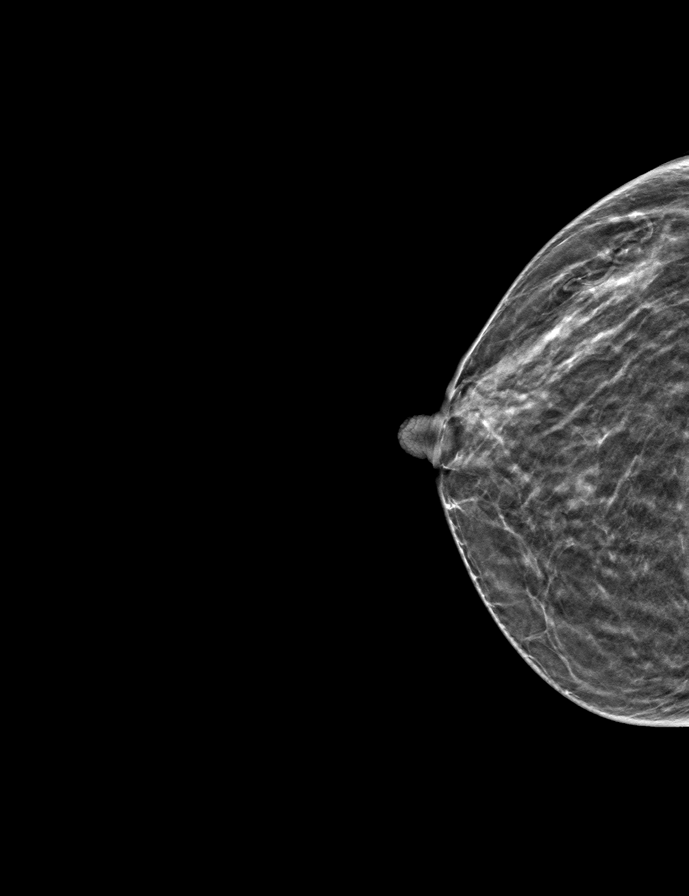
[frame 17/34]
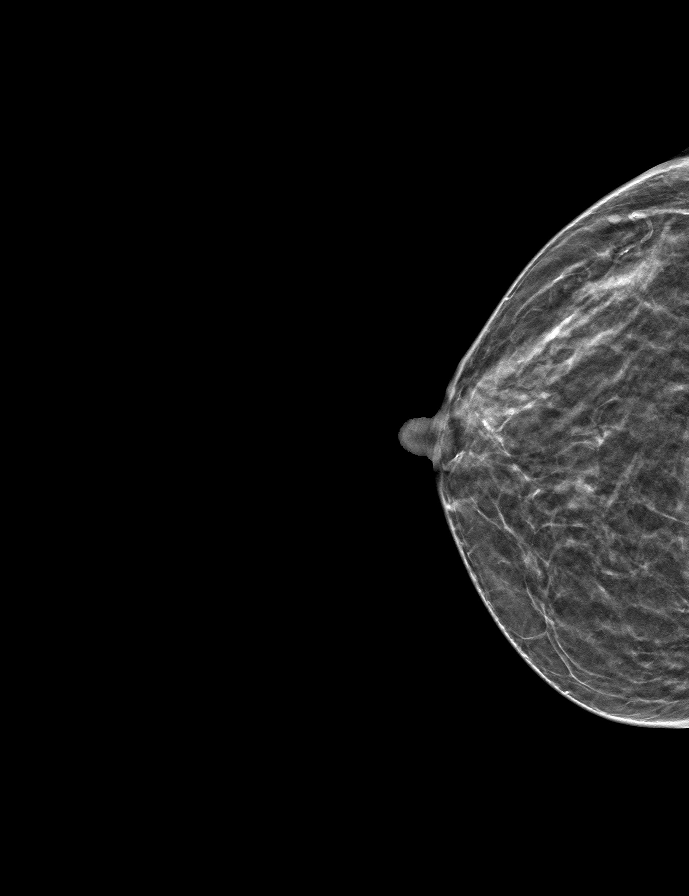

[L MLO tomo · tomo slice 17/33.0]
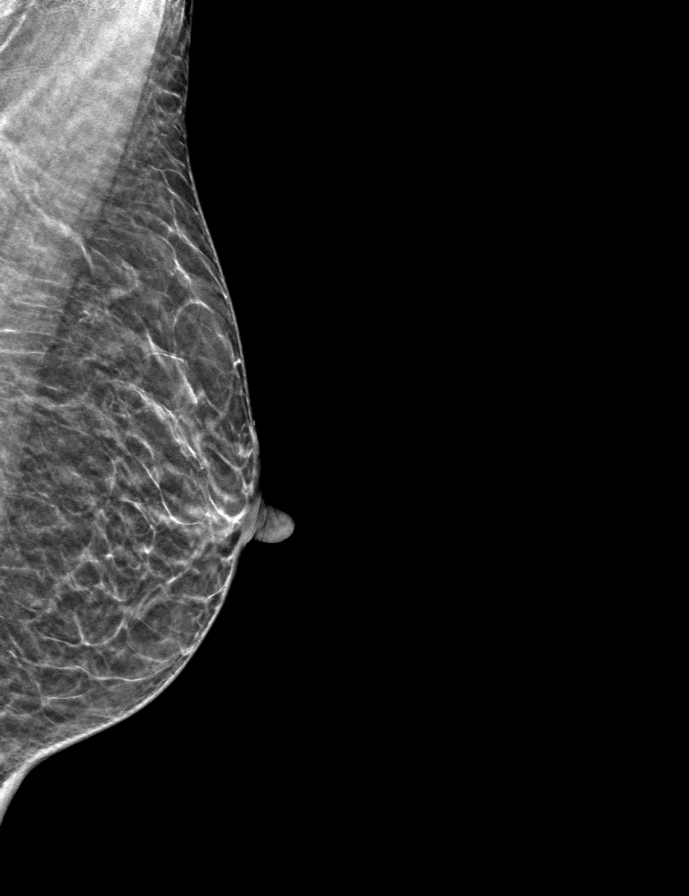

[R MLO tomo · tomo slice 17/32.0]
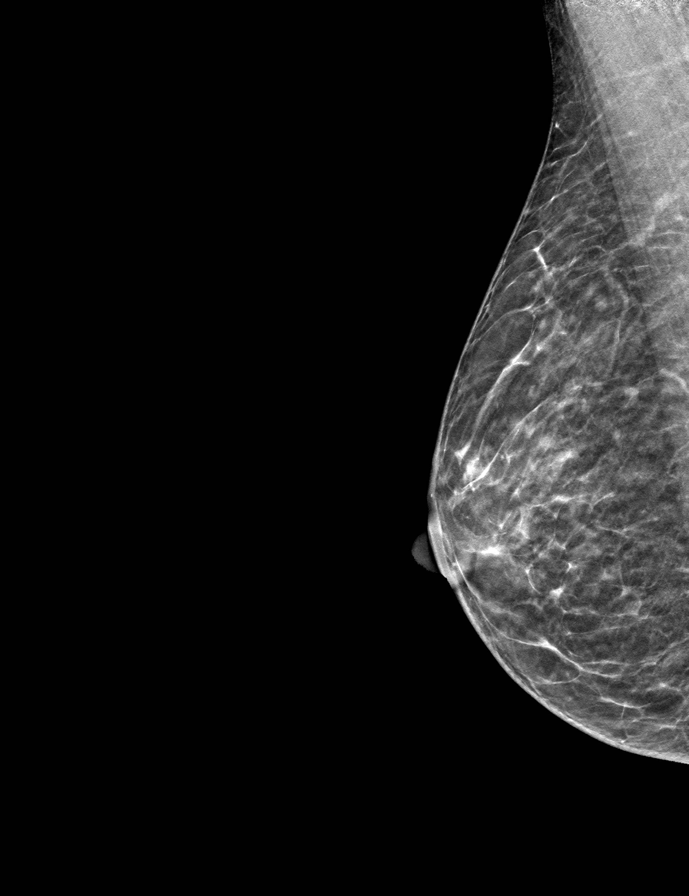

[L CC tomo · tomo slice 17/34.0]
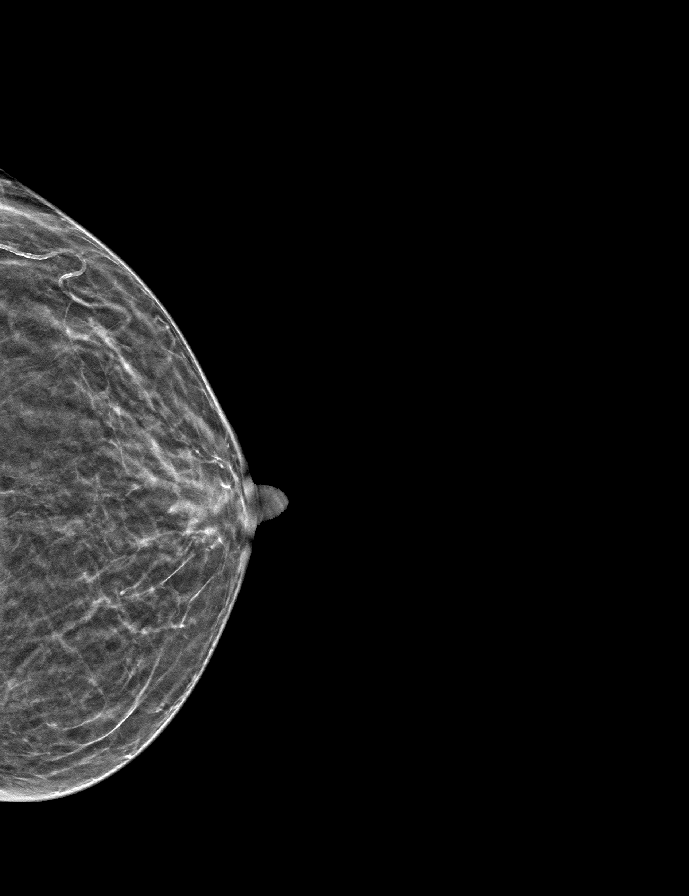

[9 of 24 positions shown; findings below may reference images not displayed]

ACR Breast Density Category b: There are scattered areas of
fibroglandular density.
FINDINGS: There are no findings suspicious for malignancy. Images were
processed with CAD.
IMPRESSION: No mammographic evidence of malignancy. A result letter of this
screening mammogram will be mailed directly to the patient.

RECOMMENDATION:
Screening mammogram in one year. (Code:[id number])

BI-RADS CATEGORY  1: Negative.

## 2021-04-25 DIAGNOSIS — H52202 Unspecified astigmatism, left eye: Secondary | ICD-10-CM | POA: Diagnosis not present

## 2021-04-25 DIAGNOSIS — H2512 Age-related nuclear cataract, left eye: Secondary | ICD-10-CM | POA: Diagnosis not present

## 2021-04-26 DIAGNOSIS — H2511 Age-related nuclear cataract, right eye: Secondary | ICD-10-CM | POA: Diagnosis not present

## 2021-05-06 DIAGNOSIS — K1120 Sialoadenitis, unspecified: Secondary | ICD-10-CM | POA: Diagnosis not present

## 2021-05-16 DIAGNOSIS — H25811 Combined forms of age-related cataract, right eye: Secondary | ICD-10-CM | POA: Diagnosis not present

## 2021-05-16 DIAGNOSIS — H52201 Unspecified astigmatism, right eye: Secondary | ICD-10-CM | POA: Diagnosis not present

## 2021-05-16 DIAGNOSIS — H2511 Age-related nuclear cataract, right eye: Secondary | ICD-10-CM | POA: Diagnosis not present

## 2021-05-17 DIAGNOSIS — R140 Abdominal distension (gaseous): Secondary | ICD-10-CM | POA: Diagnosis not present

## 2021-05-17 DIAGNOSIS — K6389 Other specified diseases of intestine: Secondary | ICD-10-CM | POA: Diagnosis not present

## 2021-05-17 DIAGNOSIS — R634 Abnormal weight loss: Secondary | ICD-10-CM | POA: Diagnosis not present

## 2021-05-23 DIAGNOSIS — K6389 Other specified diseases of intestine: Secondary | ICD-10-CM | POA: Diagnosis not present

## 2021-05-23 DIAGNOSIS — F341 Dysthymic disorder: Secondary | ICD-10-CM | POA: Diagnosis not present

## 2021-05-23 DIAGNOSIS — K5900 Constipation, unspecified: Secondary | ICD-10-CM | POA: Diagnosis not present

## 2021-05-23 DIAGNOSIS — E871 Hypo-osmolality and hyponatremia: Secondary | ICD-10-CM | POA: Diagnosis not present

## 2021-05-23 DIAGNOSIS — Z23 Encounter for immunization: Secondary | ICD-10-CM | POA: Diagnosis not present

## 2021-05-23 DIAGNOSIS — R636 Underweight: Secondary | ICD-10-CM | POA: Diagnosis not present

## 2021-05-23 DIAGNOSIS — F329 Major depressive disorder, single episode, unspecified: Secondary | ICD-10-CM | POA: Diagnosis not present

## 2021-05-23 DIAGNOSIS — K219 Gastro-esophageal reflux disease without esophagitis: Secondary | ICD-10-CM | POA: Diagnosis not present

## 2021-05-23 DIAGNOSIS — K5790 Diverticulosis of intestine, part unspecified, without perforation or abscess without bleeding: Secondary | ICD-10-CM | POA: Diagnosis not present

## 2021-06-09 DIAGNOSIS — Z0100 Encounter for examination of eyes and vision without abnormal findings: Secondary | ICD-10-CM | POA: Diagnosis not present

## 2021-06-10 DIAGNOSIS — Z713 Dietary counseling and surveillance: Secondary | ICD-10-CM | POA: Diagnosis not present

## 2021-06-10 DIAGNOSIS — Z681 Body mass index (BMI) 19 or less, adult: Secondary | ICD-10-CM | POA: Diagnosis not present

## 2021-06-15 DIAGNOSIS — R140 Abdominal distension (gaseous): Secondary | ICD-10-CM | POA: Diagnosis not present

## 2021-06-15 DIAGNOSIS — R634 Abnormal weight loss: Secondary | ICD-10-CM | POA: Diagnosis not present

## 2021-06-15 DIAGNOSIS — K6389 Other specified diseases of intestine: Secondary | ICD-10-CM | POA: Diagnosis not present

## 2021-07-25 DIAGNOSIS — K222 Esophageal obstruction: Secondary | ICD-10-CM | POA: Diagnosis not present

## 2021-07-25 DIAGNOSIS — D490 Neoplasm of unspecified behavior of digestive system: Secondary | ICD-10-CM | POA: Diagnosis not present

## 2021-07-25 DIAGNOSIS — K862 Cyst of pancreas: Secondary | ICD-10-CM | POA: Diagnosis not present

## 2021-07-29 DIAGNOSIS — K6389 Other specified diseases of intestine: Secondary | ICD-10-CM | POA: Diagnosis not present

## 2021-07-29 DIAGNOSIS — R634 Abnormal weight loss: Secondary | ICD-10-CM | POA: Diagnosis not present

## 2021-07-29 DIAGNOSIS — K581 Irritable bowel syndrome with constipation: Secondary | ICD-10-CM | POA: Diagnosis not present

## 2021-07-29 DIAGNOSIS — R140 Abdominal distension (gaseous): Secondary | ICD-10-CM | POA: Diagnosis not present

## 2021-07-29 DIAGNOSIS — Z681 Body mass index (BMI) 19 or less, adult: Secondary | ICD-10-CM | POA: Diagnosis not present

## 2021-07-29 DIAGNOSIS — Z713 Dietary counseling and surveillance: Secondary | ICD-10-CM | POA: Diagnosis not present

## 2021-08-08 ENCOUNTER — Other Ambulatory Visit: Payer: Self-pay | Admitting: Internal Medicine

## 2021-08-08 DIAGNOSIS — Z1231 Encounter for screening mammogram for malignant neoplasm of breast: Secondary | ICD-10-CM

## 2021-08-25 DIAGNOSIS — R42 Dizziness and giddiness: Secondary | ICD-10-CM | POA: Diagnosis not present

## 2021-08-25 DIAGNOSIS — H903 Sensorineural hearing loss, bilateral: Secondary | ICD-10-CM | POA: Diagnosis not present

## 2021-08-25 DIAGNOSIS — H9313 Tinnitus, bilateral: Secondary | ICD-10-CM | POA: Diagnosis not present

## 2021-09-12 ENCOUNTER — Ambulatory Visit
Admission: RE | Admit: 2021-09-12 | Discharge: 2021-09-12 | Disposition: A | Discharge status: Home / Self Care | Payer: Medicare HMO | Source: Ambulatory Visit | Attending: Internal Medicine | Admitting: Internal Medicine

## 2021-09-12 DIAGNOSIS — Z1231 Encounter for screening mammogram for malignant neoplasm of breast: Secondary | ICD-10-CM

## 2021-11-15 ENCOUNTER — Ambulatory Visit: Payer: Medicare HMO | Attending: Internal Medicine

## 2021-11-15 DIAGNOSIS — Z23 Encounter for immunization: Secondary | ICD-10-CM

## 2021-11-18 ENCOUNTER — Other Ambulatory Visit (HOSPITAL_BASED_OUTPATIENT_CLINIC_OR_DEPARTMENT_OTHER): Payer: Self-pay

## 2021-11-18 MED ORDER — MODERNA COVID-19 BIVAL BOOSTER 50 MCG/0.5ML IM SUSP
INTRAMUSCULAR | 0 refills | Status: DC
  Filled 2021-11-18: qty 0.5, 1d supply, fill #0

## 2021-11-18 NOTE — Progress Notes (Signed)
   Covid-19 Vaccination Clinic  Name:  Julia Singleton    MRN: [id number] DOB: 03/20/1946  11/18/2021  Julia Singleton was observed post Covid-19 immunization for 15 minutes without incident. She was provided with Vaccine Information Sheet and instruction to access the V-Safe system.   Julia Singleton was instructed to call 911 with any severe reactions post vaccine: Difficulty breathing  Swelling of face and throat  A fast heartbeat  A bad rash all over body  Dizziness and weakness   Immunizations Administered     Name Date Dose VIS Date Route   Moderna Covid-19 vaccine Bivalent Booster 11/15/2021  9:45 AM 0.5 mL 01/15/2021 Intramuscular   Manufacturer: [place]   Lot: [id number]   [place]: [id number]

## 2021-11-22 DIAGNOSIS — D490 Neoplasm of unspecified behavior of digestive system: Secondary | ICD-10-CM | POA: Diagnosis not present

## 2021-11-23 DIAGNOSIS — D490 Neoplasm of unspecified behavior of digestive system: Secondary | ICD-10-CM | POA: Diagnosis not present

## 2021-12-30 DIAGNOSIS — M25511 Pain in right shoulder: Secondary | ICD-10-CM | POA: Diagnosis not present

## 2022-01-11 DIAGNOSIS — G44209 Tension-type headache, unspecified, not intractable: Secondary | ICD-10-CM | POA: Diagnosis not present

## 2022-01-18 DIAGNOSIS — D490 Neoplasm of unspecified behavior of digestive system: Secondary | ICD-10-CM | POA: Diagnosis not present

## 2022-01-18 DIAGNOSIS — Z0389 Encounter for observation for other suspected diseases and conditions ruled out: Secondary | ICD-10-CM | POA: Diagnosis not present

## 2022-01-25 ENCOUNTER — Other Ambulatory Visit: Payer: Self-pay | Admitting: Internal Medicine

## 2022-01-25 ENCOUNTER — Other Ambulatory Visit (HOSPITAL_COMMUNITY): Payer: Self-pay | Admitting: Internal Medicine

## 2022-01-25 DIAGNOSIS — G44209 Tension-type headache, unspecified, not intractable: Secondary | ICD-10-CM

## 2022-01-26 ENCOUNTER — Ambulatory Visit (HOSPITAL_COMMUNITY)
Admission: RE | Admit: 2022-01-26 | Discharge: 2022-01-26 | Disposition: A | Discharge status: Home / Self Care | Payer: Medicare HMO | Source: Ambulatory Visit | Attending: Internal Medicine | Admitting: Internal Medicine

## 2022-01-26 DIAGNOSIS — G44209 Tension-type headache, unspecified, not intractable: Secondary | ICD-10-CM | POA: Insufficient documentation

## 2022-01-26 DIAGNOSIS — R519 Headache, unspecified: Secondary | ICD-10-CM | POA: Diagnosis not present

## 2022-01-26 MED ORDER — GADOBUTROL 1 MMOL/ML IV SOLN
5.0000 mL | Freq: Once | INTRAVENOUS | Status: AC | PRN
  Administered 2022-01-26: 5 mL via INTRAVENOUS

## 2022-02-13 ENCOUNTER — Encounter (HOSPITAL_BASED_OUTPATIENT_CLINIC_OR_DEPARTMENT_OTHER): Payer: Self-pay | Admitting: Obstetrics and Gynecology

## 2022-02-13 ENCOUNTER — Other Ambulatory Visit: Payer: Self-pay

## 2022-02-13 ENCOUNTER — Emergency Department (HOSPITAL_BASED_OUTPATIENT_CLINIC_OR_DEPARTMENT_OTHER)
Admission: EM | Admit: 2022-02-13 | Discharge: 2022-02-13 | Disposition: A | Discharge status: Home / Self Care | Payer: Medicare HMO | Attending: Emergency Medicine | Admitting: Emergency Medicine

## 2022-02-13 DIAGNOSIS — R531 Weakness: Secondary | ICD-10-CM | POA: Insufficient documentation

## 2022-02-13 DIAGNOSIS — R002 Palpitations: Secondary | ICD-10-CM | POA: Insufficient documentation

## 2022-02-13 HISTORY — DX: Headache, unspecified: R51.9

## 2022-02-13 HISTORY — DX: Irritable bowel syndrome, unspecified: K58.9

## 2022-02-13 LAB — CBC
HCT: 39.7 % (ref 36.0–46.0)
Hemoglobin: 13.8 g/dL (ref 12.0–15.0)
MCH: 30.7 pg (ref 26.0–34.0)
MCHC: 34.8 g/dL (ref 30.0–36.0)
MCV: 88.4 fL (ref 80.0–100.0)
Platelets: 343 10*3/uL (ref 150–400)
RBC: 4.49 MIL/uL (ref 3.87–5.11)
RDW: 13.1 % (ref 11.5–15.5)
WBC: 8.7 10*3/uL (ref 4.0–10.5)
nRBC: 0 % (ref 0.0–0.2)

## 2022-02-13 LAB — BASIC METABOLIC PANEL
Anion gap: 8 (ref 5–15)
BUN: 16 mg/dL (ref 8–23)
CO2: 26 mmol/L (ref 22–32)
Calcium: 9.9 mg/dL (ref 8.9–10.3)
Chloride: 99 mmol/L (ref 98–111)
Creatinine, Ser: 0.69 mg/dL (ref 0.44–1.00)
GFR, Estimated: >60 mL/min (ref >60)
Glucose, Bld: 106 mg/dL — ABNORMAL HIGH (ref 70–99)
Potassium: 4.2 mmol/L (ref 3.5–5.1)
Sodium: 133 mmol/L — ABNORMAL LOW (ref 135–145)

## 2022-02-13 LAB — MAGNESIUM: Magnesium: 2 mg/dL (ref 1.7–2.4)

## 2022-02-13 LAB — TROPONIN I (HIGH SENSITIVITY): Troponin I (High Sensitivity): 6 ng/L (ref <18)

## 2022-02-13 NOTE — ED Triage Notes (Signed)
Patient reports to the ER for x4 episodes of heart palpitations/tachycardia. Patient denies any cardiac hx. Patient endorses weakness when the episodes occur. Denies chest pain or ShOB.

## 2022-02-13 NOTE — ED Provider Notes (Signed)
[facility] EMERGENCY DEPT Provider Note   CSN: [id number] Arrival date & time: 02/13/22  1536     History  Chief Complaint  Patient presents with   Palpitations    Julia Singleton is a 76 y.o. female.   Palpitations Patient presents for palpitations.  Medical history includes IBS, osteoporosis.  She has had 4 episodes of intermittent palpitations in the past 6 months.  Previously, these would only last for a few seconds.  This morning, she had an episode that lasted for an estimated 5 minutes.  This was towards the end of her morning walk.  Patient typically walks for 45 minutes to an hour every morning.  She describes her pace as brisk but not overly exertional.  When patient experiences palpitations, she does feel some generalized weakness but denies any other associated symptoms.  She was not able to track her heart rate when this occurred.  She has been asymptomatic since this morning's episode, which occurred around 8 AM.     Home Medications Prior to Admission medications   Medication Sig Start Date End Date Taking? Authorizing Provider  calcium carbonate (TUMS - DOSED IN MG ELEMENTAL CALCIUM) 500 MG chewable tablet Chew 1 tablet by mouth every morning.    [provider]  cholecalciferol (VITAMIN D) 1000 UNITS tablet Take 2,000 Units by mouth every morning.    [provider]  ciprofloxacin (CIPRO) 500 MG tablet Take 1 tablet (500 mg total) by mouth 2 (two) times daily. 04/29/13   Varney Biles, MD  COVID-19 mRNA bivalent vaccine, Moderna, (MODERNA COVID-19 BIVAL BOOSTER) 50 MCG/0.5ML injection Inject into the muscle. 03/11/21   Carlyle Basques, MD  COVID-19 mRNA bivalent vaccine, Moderna, (MODERNA COVID-19 BIVAL BOOSTER) 50 MCG/0.5ML injection Inject into the muscle. 11/18/21   Carlyle Basques, MD  COVID-19 mRNA vaccine, Moderna, 100 MCG/0.5ML injection Inject into the muscle. 09/22/20   Carlyle Basques, MD  HYDROcodone-acetaminophen  (NORCO/VICODIN) 5-325 MG per tablet Take 1 tablet by mouth every 6 (six) hours as needed. 04/29/13   Varney Biles, MD  metroNIDAZOLE (FLAGYL) 500 MG tablet Take 1 tablet (500 mg total) by mouth 2 (two) times daily. 04/29/13   Varney Biles, MD  Multiple Vitamin (MULTIVITAMIN) tablet Take 1 tablet by mouth every morning.     [provider]  ondansetron (ZOFRAN ODT) 8 MG disintegrating tablet Take 1 tablet (8 mg total) by mouth every 8 (eight) hours as needed for nausea. 04/29/13   Varney Biles, MD  raloxifene (EVISTA) 60 MG tablet Take 60 mg by mouth every morning.     [provider]      Allergies    Penicillins and Pneumococcal vaccines    Review of Systems   Review of Systems  Cardiovascular:  Positive for palpitations.  All other systems reviewed and are negative.   Physical Exam Updated Vital Signs BP (!) 161/84 (BP Location: Right Arm)   Pulse 84   Temp (!) 97.2 F (36.2 C)   Resp 19   Ht 5' 5.5" (1.664 m)   Wt 49.7 kg   SpO2 100%   BMI 17.96 kg/m  Physical Exam Vitals and nursing note reviewed.  Constitutional:      General: She is not in acute distress.    Appearance: Normal appearance. She is well-developed. She is not ill-appearing, toxic-appearing or diaphoretic.  HENT:     Head: Normocephalic and atraumatic.     Right Ear: External ear normal.     Left Ear: External ear normal.  Nose: Nose normal.     Mouth/Throat:     Mouth: Mucous membranes are moist.     Pharynx: Oropharynx is clear.  Eyes:     Extraocular Movements: Extraocular movements intact.     Conjunctiva/sclera: Conjunctivae normal.  Cardiovascular:     Rate and Rhythm: Normal rate and regular rhythm.     Heart sounds: No murmur heard. Pulmonary:     Effort: Pulmonary effort is normal. No respiratory distress.  Abdominal:     General: There is no distension.     Palpations: Abdomen is soft.     Tenderness: There is no abdominal tenderness.  Musculoskeletal:         General: No swelling. Normal range of motion.     Cervical back: Normal range of motion and neck supple.     Right lower leg: No edema.     Left lower leg: No edema.  Skin:    General: Skin is warm and dry.     Capillary Refill: Capillary refill takes less than 2 seconds.     Coloration: Skin is not jaundiced or pale.  Neurological:     General: No focal deficit present.     Mental Status: She is alert and oriented to person, place, and time.     Cranial Nerves: No cranial nerve deficit.     Sensory: No sensory deficit.     Motor: No weakness.     Coordination: Coordination normal.  Psychiatric:        Mood and Affect: Mood normal.        Behavior: Behavior normal.        Thought Content: Thought content normal.        Judgment: Judgment normal.     ED Results / Procedures / Treatments   Labs (all labs ordered are listed, but only abnormal results are displayed) Labs Reviewed  BASIC METABOLIC PANEL - Abnormal; Notable for the following components:      Result Value   Sodium 133 (*)    Glucose, Bld 106 (*)    All other components within normal limits  CBC  MAGNESIUM  TROPONIN I (HIGH SENSITIVITY)  TROPONIN I (HIGH SENSITIVITY)    EKG EKG Interpretation  Date/Time:  Monday February 13 2022 15:45:24 EDT Ventricular Rate:  96 PR Interval:  134 QRS Duration: 92 QT Interval:  356 QTC Calculation: 449 R Axis:   90 Text Interpretation: Normal sinus rhythm Possible Left atrial enlargement Rightward axis Confirmed by Godfrey Pick [id number]) on 02/13/2022 5:29:57 PM  Radiology No results found.  Procedures Procedures    Medications Ordered in ED Medications - No data to display  ED Course/ Medical Decision Making/ A&P                           Medical Decision Making Amount and/or Complexity of Data Reviewed Labs: ordered.   This patient presents to the ED for concern of palpitations, this involves an extensive number of treatment options, and is a complaint  that carries with it a high risk of complications and morbidity.  The differential diagnosis includes A-fib with RVR, atrial flutter, SVT, sinus tachycardia   Co morbidities that complicate the patient evaluation  IBS, osteoporosis   Additional history obtained:  Additional history obtained from N/A External records from outside source obtained and reviewed including MR   Lab Tests:  I Ordered, and personally interpreted labs.  The pertinent results include: Normal electrolytes, normal hemoglobin,  normal troponin  Cardiac Monitoring: / EKG:  The patient was maintained on a cardiac monitor.  I personally viewed and interpreted the cardiac monitored which showed an underlying rhythm of: Sinus rhythm   Problem List / ED Course / Critical interventions / Medication management  Patient is a pleasant 76 year old female presenting for a transient episode of palpitations this morning.  This occurred at approximately 8 AM and lasted for an estimated 5 minutes.  She has been asymptomatic throughout the rest of the day.  She describes similar episodes occurring infrequently over the past 6 months.  Prior to day, she had 3 prior episodes which were shorter in duration than today.  Prior to being bedded in the ED, patient underwent EKG and lab work.  EKG shows normal sinus rhythm.  Laboratory work-up shows normal electrolytes and no anemia.  Troponin was normal as well.  On bedside cardiac monitor, patient remains in a sinus rhythm.  She is well-appearing on exam.  EKG does show evidence of atrial enlargement which could be a cause of intermittent A-fib.  Patient would benefit from cardiology follow-up with possible Zio patch monitoring.  At this point, given the very infrequent nature of these episodes as well as the short duration, will not initiate anticoagulation.  Cardiology referral was ordered and patient was provided with contact information.  She was advised to return for any further sustained  episodes.  She was discharged in good condition.   Social Determinants of Health:  Has access to outpatient care        Final Clinical Impression(s) / ED Diagnoses Final diagnoses:  Palpitations    Rx / DC Orders ED Discharge Orders          Ordered    Ambulatory referral to Cardiology       Comments: If you have not heard from the Cardiology office within the next 72 hours please call [phone].   02/13/22 1754              Godfrey Pick, MD 02/13/22 1756

## 2022-02-13 NOTE — Discharge Instructions (Addendum)
You should hear from cardiology office within the next couple days to set up a follow-up appointment.  If you do not hear from them, call the number below to set up that appointment.  Return to the emergency department at any time for any recurrence of sustained episodes or any other symptoms of concern.

## 2022-02-15 DIAGNOSIS — R002 Palpitations: Secondary | ICD-10-CM | POA: Insufficient documentation

## 2022-02-15 DIAGNOSIS — I10 Essential (primary) hypertension: Secondary | ICD-10-CM | POA: Insufficient documentation

## 2022-02-15 NOTE — Progress Notes (Signed)
Cardiology Office Note   Date:  02/16/2022   ID:  Julia Singleton, DOB 1946/05/09, MRN [id number]  PCP:  Shon Baton, MD  Cardiologist:   None Referring:  Shon Baton, MD   Chief Complaint  Patient presents with   Palpitations      History of Present Illness: Julia Singleton is a 76 y.o. female who presents for evaluation of palpitations.  Palpitations.  She was in the emergency room for this on 9/11 and I reviewed these records for this visit.  She said that this happens out of the blue.  She is typically at rest.  1 day she was on a walk.  It is happening about 4 times in the last 4 to 6 months.  The longest was about 5 minutes.  The shortness was a few beats.  She went to the emergency room the other day but they did not capture this.  She did not have any chest pressure, neck or arm discomfort.  However, when it happens she gets fatigued.  She did have a low blood pressure and dizziness at 1 point.  She has not had any actual passing out.  She has not had any new shortness of breath, PND or orthopnea.  She walks for about an hour 5 days a week.  With this she has no symptoms.  She has had some labile blood pressures with 1 reading of 160/93 but this is very unusual.  She had the episode with systolic in the 82X but again this is unusual.  Her blood pressure is usually in the 120s over 70s.  She is otherwise not had any cardiac work-up and may be a stress test years ago.   Past Medical History:  Diagnosis Date   Diverticulitis    Headache    IBS (irritable bowel syndrome)    Osteoporosis     Past Surgical History:  Procedure Laterality Date   BREAST BIOPSY Right    benign   TONSILLECTOMY AND ADENOIDECTOMY       Current Outpatient Medications  Medication Sig Dispense Refill   CALCIUM CITRATE PO Take by mouth.     cholecalciferol (VITAMIN D) 1000 UNITS tablet Take 2,000 Units by mouth every morning.     loratadine (CLARITIN) 10 MG tablet Take 1 tablet by mouth daily.      Multiple Vitamin (MULTIVITAMIN) tablet Take 1 tablet by mouth every morning.      Polyethylene Glycol 3350 (MIRALAX PO) Miralax     raloxifene (EVISTA) 60 MG tablet Take 60 mg by mouth every morning.      ACETAMINOPHEN PO Take 500 mg by mouth as needed.     ALPRAZolam (XANAX XR) 0.5 MG 24 hr tablet Take 0.5 mg by mouth daily.     [employer] FIBER GUMMIES PO Take by mouth.     fexofenadine (ALLEGRA) 180 MG tablet Take 180 mg by mouth as needed for allergies or rhinitis.     fluocinonide ointment (LIDEX) 9.37 % Apply 1 Application topically 2 (two) times daily.     ibuprofen (ADVIL) 200 MG tablet Take 200 mg by mouth as needed.     Simethicone (GAS-X PO) Take by mouth.     No current facility-administered medications for this visit.    Allergies:   Penicillins and Pneumococcal vaccines    Social History:  The patient  reports that she has never smoked. She has never been exposed to tobacco smoke. She does not have any smokeless tobacco history  on file. She reports current alcohol use. She reports that she does not use drugs.   Family History:  The patient's family history includes Heart failure in her father; Lymphoma in her mother.    ROS:  Please see the history of present illness.   Otherwise, review of systems are positive for hot flashes and are proceeding this, weight loss.   All other systems are reviewed and negative.    PHYSICAL EXAM: VS:  BP (!) 140/82   Pulse 98   Ht '5\' 6"'$  (1.676 m)   Wt 113 lb 12.8 oz (51.6 kg)   SpO2 99%   BMI 18.37 kg/m  , BMI Body mass index is 18.37 kg/m. GENERAL:  Well appearing HEENT:  Pupils equal round and reactive, fundi not visualized, oral mucosa unremarkable NECK:  No jugular venous distention, waveform within normal limits, carotid upstroke brisk and symmetric, no bruits, no thyromegaly LYMPHATICS:  No cervical, inguinal adenopathy LUNGS:  Clear to auscultation bilaterally BACK:  No CVA tenderness CHEST:  Unremarkable HEART:  PMI not displaced  or sustained,S1 and S2 within normal limits, no S3, no S4, no clicks, no rubs, no murmurs ABD:  Flat, positive bowel sounds normal in frequency in pitch, no bruits, no rebound, no guarding, no midline pulsatile mass, no hepatomegaly, no splenomegaly EXT:  2 plus pulses throughout, no edema, no cyanosis no clubbing SKIN:  No rashes no nodules NEURO:  Cranial nerves II through XII grossly intact, motor grossly intact throughout PSYCH:  Cognitively intact, oriented to person place and time    EKG:  EKG is not ordered today. The ekg ordered today demonstrates sinus rhythm, rate 96, axis within normal limits, intervals within normal limits, no acute ST-T wave changes.   Recent Labs: 02/13/2022: BUN 16; Creatinine, Ser 0.69; Hemoglobin 13.8; Magnesium 2.0; Platelets 343; Potassium 4.2; Sodium 133    Lipid Panel No results found for: "CHOL", "TRIG", "HDL", "CHOLHDL", "VLDL", "LDLCALC", "LDLDIRECT"    Wt Readings from Last 3 Encounters:  02/16/22 113 lb 12.8 oz (51.6 kg)  02/13/22 109 lb 9.6 oz (49.7 kg)  04/29/13 125 lb (56.7 kg)      Other studies Reviewed: Additional studies/ records that were reviewed today include: ED records. Review of the above records demonstrates:  Please see elsewhere in the note.     ASSESSMENT AND PLAN:  Palpitations: At this point I will try to catch this on a 4-week monitor.  We also discussed with her and gave her the name of several wearable's which would be very helpful.  Her electrolytes are unremarkable and she is about to get her TSH checked by her primary provider.  Further management will be based on these results.  HTN: Her blood pressure today is mildly elevated but this is unusual.  Again the systolics are typically in the 120s and I would not suggest any change in therapy at this point.   Current medicines are reviewed at length with the patient today.  The patient does not have concerns regarding medicines.  The following changes have been  made:  no change  Labs/ tests ordered today include:   Orders Placed This Encounter  Procedures   CARDIAC EVENT MONITOR     Disposition:   FU with me as needed based on the results of the above.     Signed, Minus Breeding, MD  02/16/2022 6:49 PM    [place]

## 2022-02-16 ENCOUNTER — Encounter: Payer: Self-pay | Admitting: *Deleted

## 2022-02-16 ENCOUNTER — Encounter: Payer: Self-pay | Admitting: Cardiology

## 2022-02-16 ENCOUNTER — Ambulatory Visit: Payer: Medicare HMO | Attending: Cardiology | Admitting: Cardiology

## 2022-02-16 VITALS — BP 140/82 | HR 98 | Ht 66.0 in | Wt 113.8 lb

## 2022-02-16 DIAGNOSIS — R002 Palpitations: Secondary | ICD-10-CM

## 2022-02-16 DIAGNOSIS — I10 Essential (primary) hypertension: Secondary | ICD-10-CM

## 2022-02-16 NOTE — Patient Instructions (Signed)
Medication Instructions:  NO CHANGES  *If you need a refill on your cardiac medications before your next appointment, please call your pharmacy*   Testing/Procedures: Dr. Percival Spanish has ordered a 4 week monitor. This will come to your house with instructions on how to apply, wear, and return monitor.    Follow-Up: At [facility], you and your health needs are our priority.  As part of our continuing mission to provide you with exceptional heart care, we have created designated Provider Care Teams.  These Care Teams include your primary Cardiologist (physician) and Advanced Practice Providers (APPs -  Physician Assistants and Nurse Practitioners) who all work together to provide you with the care you need, when you need it.  We recommend signing up for the patient portal called "MyChart".  Sign up information is provided on this After Visit Summary.  MyChart is used to connect with patients for Virtual Visits (Telemedicine).  Patients are able to view lab/test results, encounter notes, upcoming appointments, etc.  Non-urgent messages can be sent to your provider as well.   To learn more about what you can do with MyChart, go to [email or web address].    Your next appointment:   6-8 weeks with NP or PA  Other Instructions  FitBit Apple Watch Kard[employer] by [employer]

## 2022-02-17 ENCOUNTER — Ambulatory Visit: Payer: Medicare HMO | Admitting: Neurology

## 2022-02-17 ENCOUNTER — Encounter: Payer: Self-pay | Admitting: Neurology

## 2022-02-17 VITALS — BP 146/86 | HR 92 | Ht 65.5 in | Wt 112.4 lb

## 2022-02-17 DIAGNOSIS — M542 Cervicalgia: Secondary | ICD-10-CM

## 2022-02-17 DIAGNOSIS — M5412 Radiculopathy, cervical region: Secondary | ICD-10-CM | POA: Diagnosis not present

## 2022-02-17 DIAGNOSIS — M5481 Occipital neuralgia: Secondary | ICD-10-CM

## 2022-02-17 DIAGNOSIS — G8929 Other chronic pain: Secondary | ICD-10-CM | POA: Diagnosis not present

## 2022-02-17 DIAGNOSIS — M4712 Other spondylosis with myelopathy, cervical region: Secondary | ICD-10-CM | POA: Diagnosis not present

## 2022-02-17 DIAGNOSIS — M8580 Other specified disorders of bone density and structure, unspecified site: Secondary | ICD-10-CM

## 2022-02-17 NOTE — Progress Notes (Addendum)
[facility]    Provider:  Dr Jaynee Eagles Requesting Provider: Shon Baton, MD Primary Care Provider:  Shon Baton, MD  CC:  neck pai  HPI:  Julia Singleton is a 76 y.o. female here as requested by Shon Baton, MD for headaches. PMHx HTN, osteoporosis, headache.  I reviewed Dr. Keane Police notes: Patient was last seen on August 9, headache began 8 days prior, no injury, located in the back of her head, radiates to the top of the head, like someone's pulled a ponytail too tight, worse at night, denies throbbing pain just a constant pull 3-4 out of 10 at its highest, denies falls injuries, denies blurred vision denies dizziness denies nausea vomiting denies light or sound sensitivity has tried Tylenol ibuprofen no relief not typical for her to have headaches, does not wake her from sleep, no fevers confusion speech changes dizziness numbness anywhere in her body.  Dr. Virgina Jock started of prednisone pack, Robaxin, suspected it may be from tension type or stress or from right shoulder issues, he recommended neck stretching moist heat and Tylenol, physical therapy or massage, Tylenol as needed.  Patient is here. She is feeling better since seeing Dr. Virgina Jock. In the back of the head, started 01/02/2022, nothing happened, nothing has helped, OTC medications, prednisone, muscle relaxer, no hx of headaches or migraines, not pulsating/pounding/throbbing or migrainous.   Reviewed notes, labs and imaging from outside physicians, which showed:  MRI brain 01/26/2022:(reviewed inages, 10 minutes not included during visit) 1. No acute intracranial abnormality. No explanation for acute headache. 2. Mild for age cerebral white matter signal changes, nonspecific but most commonly due to chronic small vessel disease. 3. Mild chronic paranasal sinus inflammation, significance doubtful.   Cbc normal, cmp slightly low sodium 02/2022  Review of Systems: Patient complains of symptoms per HPI as well as the  following symptoms neck and back of head pain. Pertinent negatives and positives per HPI. All others negative.   Social History   Socioeconomic History   Marital status: Married    Spouse name: Not on file   Number of children: Not on file   Years of education: Not on file   Highest education level: Not on file  Occupational History   Not on file  Tobacco Use   Smoking status: Never    Passive exposure: Never   Smokeless tobacco: Never  Vaping Use   Vaping Use: Never used  Substance and Sexual Activity   Alcohol use: Yes    Comment: Rare   Drug use: No   Sexual activity: Yes  Other Topics Concern   Not on file  Social History Narrative   Married.  Lives at home with husband.  3 children and 6 grand.     Retired Agricultural engineer   Education: Surveyor, mining.   Caffeine: coke on rare basis, hot tea once in while.   Social Determinants of Health   Financial Resource Strain: Not on file  Food Insecurity: Not on file  Transportation Needs: Not on file  Physical Activity: Not on file  Stress: Not on file  Social Connections: Not on file  Intimate Partner Violence: Not on file    Family History  Problem Relation Age of Onset   Lymphoma Mother    Heart failure Father        Later onset   Migraines Neg Hx     Past Medical History:  Diagnosis Date   Diverticulitis    Headache    IBS (irritable bowel syndrome)  Osteoporosis     Patient Active Problem List   Diagnosis Date Noted   Bilateral occipital neuralgia 02/20/2022   Palpitations 02/15/2022   Essential hypertension 02/15/2022    Past Surgical History:  Procedure Laterality Date   BREAST BIOPSY Right    benign   TONSILLECTOMY AND ADENOIDECTOMY      Current Outpatient Medications  Medication Sig Dispense Refill   ACETAMINOPHEN PO Take 500 mg by mouth as needed.     ALPRAZolam (XANAX XR) 0.5 MG 24 hr tablet Take 0.5 mg by mouth daily.     CALCIUM CITRATE PO Take by mouth.     cholecalciferol (VITAMIN D)  1000 UNITS tablet Take 2,000 Units by mouth every morning.     fexofenadine (ALLEGRA) 180 MG tablet Take 180 mg by mouth as needed for allergies or rhinitis.     fluocinonide ointment (LIDEX) 9.50 % Apply 1 Application topically 2 (two) times daily.     loratadine (CLARITIN) 10 MG tablet Take 1 tablet by mouth daily.     Multiple Vitamin (MULTIVITAMIN) tablet Take 1 tablet by mouth every morning.      OVER THE COUNTER MEDICATION FD Guard (caraway oil and menthol) one before meals as needed)     Polyethylene Glycol 3350 (MIRALAX PO) Miralax     raloxifene (EVISTA) 60 MG tablet Take 60 mg by mouth every morning.      Simethicone (GAS-X PO) Take by mouth.     COVID-19 mRNA vaccine 2023-2024 (COMIRNATY) syringe Inject into the muscle. 0.3 mL 0   influenza vaccine adjuvanted (FLUAD) 0.5 ML injection Inject into the muscle. 0.5 mL 0   No current facility-administered medications for this visit.    Allergies as of 02/17/2022 - Review Complete 02/17/2022  Allergen Reaction Noted   Penicillins Other (See Comments) 09/26/2012   Pneumococcal vaccines Hives and Swelling 04/29/2013    Vitals: BP (!) 146/86   Pulse 92   Ht 5' 5.5" (1.664 m)   Wt 112 lb 6.4 oz (51 kg)   BMI 18.42 kg/m  Last Weight:  Wt Readings from Last 1 Encounters:  04/06/22 114 lb (51.7 kg)   Last Height:   Ht Readings from Last 1 Encounters:  04/06/22 5' 5.5" (1.664 m)     Physical exam: Exam: Gen: NAD, conversant, well nourised, well groomed                     CV: RRR, no MRG. No Carotid Bruits. No peripheral edema, warm, nontender Eyes: Conjunctivae clear without exudates or hemorrhage  Neuro: Detailed Neurologic Exam  Speech:    Speech is normal; fluent and spontaneous with normal comprehension.  Cognition:    The patient is oriented to person, place, and time;     recent and remote memory intact;     language fluent;     normal attention, concentration,     fund of knowledge Cranial Nerves:    The  pupils are equal, round, and reactive to light. The fundi are normal and spontaneous venous pulsations are present. Visual fields are full to finger confrontation. Extraocular movements are intact. Trigeminal sensation is intact and the muscles of mastication are normal. The face is symmetric. The palate elevates in the midline. Hearing intact. Voice is normal. Shoulder shrug is normal. The tongue has normal motion without fasciculations.   Coordination:    Normal finger to nose and heel to shin. Normal rapid alternating movements.   Gait:    Heel-toe and tandem  gait are normal.   Motor Observation:    No asymmetry, no atrophy, and no involuntary movements noted. Tone:    Normal muscle tone.    Posture:    Posture is normal. normal erect    Strength:    Strength is V/V in the upper and lower limbs.      Sensation: intact to LT     Reflex Exam:  DTR's:    Deep tendon reflexes in the upper and lower extremities are normal bilaterally.   Toes:    The toes are downgoing bilaterally.   Clonus:    Clonus is absent.    Assessment/Plan:  Julia Singleton is a 76 y.o. female here as requested by Shon Baton, MD for headaches. PMHx HTN, osteoporosis, headache.  I reviewed Dr. Keane Police notes: Patient was last seen on August 9, headache began 8 days prior, no injury, located in the back of her head, radiates to the top of the head, like someone's "pulled a ponytail too tight" worse at night. MRI of the brain normal.  Pain is bilateral and is located in the distribution of the greater, lesser and/or third occipital nerves, painful, sharp, tender in between, with tenderness and trigger points at the emergence of the greater occipital nerve. Differential includes pain in the upper cervical joints or disks, suboccipital or upper posterior neck muscles including the traps/scm, spinal and posterior cranial fossa dura mater, compressive disk disease and others. Recommend MRI of the cervical spine to  evaluate for surgical or interventional options (ESI/branch blocks)  - I agree with Dr. Virgina Jock, likely cervicogenic or occipital nerve irritation. He did a great job with prednisone pack, Robaxin, recommend a few things:  Physical therapy or MRI of the cervical spine to examine where those nerves come out of the neck and see if you have arthritis there, then send you for injections(epidural steroid injections or medial branch blocks you can look those up) into the neck, let me know how you want me to proceed.. - will perform nerve blocks today, she did well pain improved.  Performed by Dr. Jaynee Eagles M.D. All procedures a documented blood were medically necessary, reasonable and appropriate based on the patient's history, medical diagnosis and physician opinion. Verbal informed consent was obtained from the patient, patient was informed of potential risk of procedure, including bruising, bleeding, hematoma formation, infection, muscle weakness, muscle pain, numbness, transient hypertension, transient hyperglycemia and transient insomnia among others. All areas injected were topically clean with isopropyl rubbing alcohol. Nonsterile nonlatex gloves were worn during the procedure.  1. Greater occipital nerve block [id number]). The greater occipital nerve site was identified at the nuchal line medial to the occipital artery. Medication was injected into the left and right occipital nerve areas and suboccipital areas. Patient's condition is associated with inflammation of the greater occipital nerve and associated multiple groups. Injection was deemed medically necessary, reasonable and appropriate. Injection represents a separate and unique surgical service.  Pain improved from 4 to a zero she will mychart me later or over the weekend.    Cc: Shon Baton, MD,  Shon Baton, MD  Sarina Ill, MD  [facility] [address] [place], [place] [address]  Phone [phone] Fax  [phone]  I spent over 60 minutes of face-to-face and non-face-to-face time with patient on the  1. Bilateral occipital neuralgia   2. Cervical radiculopathy   3. 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  This included previsit chart review, lab review, study review, order entry, electronic health record documentation, patient education on the different diagnostic and therapeutic options, counseling and coordination of care, risks and benefits of management, compliance, or risk factor reduction. Not including nerve blocks.

## 2022-02-17 NOTE — Patient Instructions (Addendum)
Occipital Neuralgia  Occipital neuralgia is a type of headache that causes brief episodes of very bad pain in the back of the head. Pain from occipital neuralgia may spread (radiate) to other parts of the head. These headaches may be caused by irritation of the nerves that leave the spinal cord high up in the neck, just below the base of the skull (occipital nerves). The occipital nerves transmit sensations from the back of the head, the top of the head, and the areas behind the ears. What are the causes? This condition can occur without any known cause (primary headache syndrome). In other cases, this condition is caused by pressure on or irritation of one of the two occipital nerves. Pressure and irritation may be due to: Muscle spasm in the neck. Neck injury. Wear and tear of the vertebrae in the neck (osteoarthritis). Disease of the disks that separate the vertebrae. Swollen blood vessels that put pressure on the occipital nerves. Infections. Tumors. Diabetes. What are the signs or symptoms? This condition causes brief burning, stabbing, electric, shocking, or shooting pain in the back of the head that can radiate to the top of the head. It can happen on one side or both sides of the head. It can also cause: Pain behind the eye. Pain triggered by neck movement or hair brushing. Scalp tenderness. Aching in the back of the head between episodes of very bad pain. Pain that gets worse with exposure to bright lights. How is this diagnosed? Your health care provider may diagnose the condition based on a physical exam and your symptoms. Tests may be done, such as: Imaging studies of the brain and neck (cervical spine), such as an MRI or CT scan. These look for causes of pinched nerves. Applying pressure to the nerves in the neck to try to re-create the pain. Injection of numbing medicine into the occipital nerve areas to see if pain goes away (diagnostic nerve block). How is this  treated? Treatment for this condition may begin with simple measures, such as: Rest. Massage. Applying heat or cold to the area. Over-the-counter pain relievers. If these measures do not work, you may need other treatments, including: Medicines, such as: Prescription-strength anti-inflammatory medicines. Muscle relaxants. Anti-seizure medicines, which can relieve pain. Antidepressants, which can relieve pain. Injected medicines, such as medicines that numb the area (local anesthetic) and steroids. Pulsed radiofrequency ablation. This is when wires are implanted to deliver electrical impulses that block pain signals from the occipital nerve. Surgery to relieve nerve pressure. Physical therapy. Follow these instructions at home: Managing pain     Avoid any activities that cause pain. Rest when you have an attack of pain. Try gentle massage to relieve pain. Try a different pillow or sleeping position. If directed, apply heat to the affected area as often as told by your health care provider. Use the heat source that your health care provider recommends, such as a moist heat pack or a heating pad. Place a towel between your skin and the heat source. Leave the heat on for 20-30 minutes. Remove the heat if your skin turns bright red. This is especially important if you are unable to feel pain, heat, or cold. You have a greater risk of getting burned. If directed, put ice on the back of your head and neck area. To do this: Put ice in a plastic bag. Place a towel between your skin and the bag. Leave the ice on for 20 minutes, 2-3 times a day. Remove the ice   if your skin turns bright red. This is very important. If you cannot feel pain, heat, or cold, you have a greater risk of damage to the area. General instructions Take over-the-counter and prescription medicines only as told by your health care provider. Avoid things that make your symptoms worse, such as bright lights. Try to stay  active. Get regular exercise that does not cause pain. Ask your health care provider to suggest safe exercises for you. Work with a physical therapist to learn stretching exercises you can do at home. Practice good posture. Keep all follow-up visits. This is important. Contact a health care provider if: Your medicine is not working. You have new or worsening symptoms. Get help right away if: You have very bad head pain that does not go away. You have a sudden change in vision, balance, or speech. These symptoms may represent a serious problem that is an emergency. Do not wait to see if the symptoms will go away. Get medical help right away. Call your local emergency services (911 in the [place] Do not drive yourself to the hospital. Summary Occipital neuralgia is a type of headache that causes brief episodes of very bad pain in the back of the head. Pain from occipital neuralgia may spread (radiate) to other parts of the head. Treatment for this condition includes rest, massage, and medicines. This information is not intended to replace advice given to you by your health care provider. Make sure you discuss any questions you have with your health care provider. Document Revised: 03/21/2020 Document Reviewed: 03/21/2020 Elsevier Patient Education  2023 Elsevier Inc.  

## 2022-02-18 ENCOUNTER — Encounter: Payer: Self-pay | Admitting: Neurology

## 2022-02-18 DIAGNOSIS — M5481 Occipital neuralgia: Secondary | ICD-10-CM

## 2022-02-20 ENCOUNTER — Encounter: Payer: Self-pay | Admitting: Neurology

## 2022-02-20 DIAGNOSIS — M5481 Occipital neuralgia: Secondary | ICD-10-CM | POA: Insufficient documentation

## 2022-02-21 DIAGNOSIS — M810 Age-related osteoporosis without current pathological fracture: Secondary | ICD-10-CM | POA: Diagnosis not present

## 2022-02-21 DIAGNOSIS — Z0000 Encounter for general adult medical examination without abnormal findings: Secondary | ICD-10-CM | POA: Diagnosis not present

## 2022-02-21 DIAGNOSIS — Z1212 Encounter for screening for malignant neoplasm of rectum: Secondary | ICD-10-CM | POA: Diagnosis not present

## 2022-02-21 DIAGNOSIS — I700 Atherosclerosis of aorta: Secondary | ICD-10-CM | POA: Diagnosis not present

## 2022-02-21 DIAGNOSIS — E042 Nontoxic multinodular goiter: Secondary | ICD-10-CM | POA: Diagnosis not present

## 2022-02-21 MED ORDER — BUPIVACAINE HCL 0.5 % IJ SOLN
6.0000 mL | Freq: Once | INTRAMUSCULAR | Status: AC
  Administered 2022-02-17: 6 mL

## 2022-02-21 NOTE — Addendum Note (Signed)
Addended by: Gildardo Griffes on: 02/21/2022 09:55 AM   Modules accepted: Orders

## 2022-02-23 ENCOUNTER — Telehealth: Payer: Self-pay | Admitting: Cardiology

## 2022-02-23 NOTE — Telephone Encounter (Signed)
Monitor scheduled to be delivered by [employer] Tomorrow, September 22 by 7:00 PM.  UPS tracking # [id number]. Telephone number for [employer] is [phone].  You can call if package is not delivered.

## 2022-02-23 NOTE — Telephone Encounter (Signed)
Pt calling on the status of her heart monitor being mailed out.

## 2022-02-23 NOTE — Telephone Encounter (Signed)
Spoke with pt, according to the records, the monitor was mailed on 02/17/22 and she still has not received it. Aware will make the monitor tech aware.

## 2022-02-25 ENCOUNTER — Ambulatory Visit: Payer: Medicare HMO | Attending: Cardiology

## 2022-02-25 DIAGNOSIS — R002 Palpitations: Secondary | ICD-10-CM

## 2022-02-28 DIAGNOSIS — R002 Palpitations: Secondary | ICD-10-CM | POA: Diagnosis not present

## 2022-02-28 DIAGNOSIS — G44209 Tension-type headache, unspecified, not intractable: Secondary | ICD-10-CM | POA: Diagnosis not present

## 2022-02-28 DIAGNOSIS — I700 Atherosclerosis of aorta: Secondary | ICD-10-CM | POA: Diagnosis not present

## 2022-02-28 DIAGNOSIS — M810 Age-related osteoporosis without current pathological fracture: Secondary | ICD-10-CM | POA: Diagnosis not present

## 2022-02-28 DIAGNOSIS — F329 Major depressive disorder, single episode, unspecified: Secondary | ICD-10-CM | POA: Diagnosis not present

## 2022-02-28 DIAGNOSIS — R636 Underweight: Secondary | ICD-10-CM | POA: Diagnosis not present

## 2022-02-28 DIAGNOSIS — Z0000 Encounter for general adult medical examination without abnormal findings: Secondary | ICD-10-CM | POA: Diagnosis not present

## 2022-02-28 DIAGNOSIS — G4700 Insomnia, unspecified: Secondary | ICD-10-CM | POA: Diagnosis not present

## 2022-02-28 DIAGNOSIS — E042 Nontoxic multinodular goiter: Secondary | ICD-10-CM | POA: Diagnosis not present

## 2022-02-28 DIAGNOSIS — R030 Elevated blood-pressure reading, without diagnosis of hypertension: Secondary | ICD-10-CM | POA: Diagnosis not present

## 2022-02-28 DIAGNOSIS — R82998 Other abnormal findings in urine: Secondary | ICD-10-CM | POA: Diagnosis not present

## 2022-03-09 ENCOUNTER — Ambulatory Visit: Payer: Medicare HMO | Admitting: Cardiology

## 2022-03-13 ENCOUNTER — Telehealth: Payer: Self-pay | Admitting: Neurology

## 2022-03-13 NOTE — Telephone Encounter (Signed)
Referral for Physical therapy sent to [facility] as requested by physician. Phone: [phone], Fax: [phone]

## 2022-03-13 NOTE — Telephone Encounter (Signed)
Spoke with Dr Jaynee Eagles. Referral placed to PT @ [facility] for occipital neuralgia per v.o. Dr Jaynee Eagles.

## 2022-03-14 ENCOUNTER — Encounter: Payer: Self-pay | Admitting: Cardiology

## 2022-03-14 ENCOUNTER — Telehealth: Payer: Self-pay | Admitting: *Deleted

## 2022-03-14 NOTE — Telephone Encounter (Signed)
Error

## 2022-03-14 NOTE — Telephone Encounter (Signed)
-----   Message from April Henson sent at 03/14/2022  9:21 AM EDT ----- Contact: [phone] Patient is requesting call back in regards to her monitor. She states that she doesn't know how much longer she can take the discomfort. She also states that area is now turning red and angry and would like to speak to someone in regards to this. Requesting advice and options.

## 2022-03-14 NOTE — Telephone Encounter (Signed)
Sorry to hear you are having irritation from your monitor strips.  At this time you have a couple different options. You could call [employer] at [phone] and request an early termination.  They will begin preparing your End of Service Report for Dr. Cherlyn Cushing review.  Of note, MCT is any monitor up to 30 days.  It will be billed out the same regardless of you wearing the monitor 14 days or 30 days. You could also remove your strip and allow your skin to heal for a couple days.  Call [employer] at [phone] and request strips for sensitive skin.  When you do receive them alternate the location veritical or horizontal when you change strips.  This will help reduce the amount of irritation in one location. Please call Antonious Omahoney in monitors at [phone] if we can be of further assistance.

## 2022-03-20 NOTE — Telephone Encounter (Signed)
Received fax from [facility] that their therapy dept does not treat for this dx. Referral will need to be sent elsewhere.

## 2022-03-23 NOTE — Addendum Note (Signed)
Addended by: Gildardo Griffes on: 03/23/2022 03:13 PM   Modules accepted: Orders

## 2022-03-23 NOTE — Telephone Encounter (Signed)
I checked with outpatient neurorehab next door. Their PTs do see pt's for occipital neuralgia.

## 2022-04-01 NOTE — Progress Notes (Addendum)
Cardiology Office Note:    Date:  04/06/2022   ID:  Julia Singleton, DOB Jul 26, 1945, MRN [id number]  PCP:  Shon Baton, MD  Cardiologist:  Minus Breeding, MD  Electrophysiologist:  None   Referring MD: Shon Baton, MD   Chief Complaint: follow-up of palpitations   History of Present Illness:    Julia Singleton is a 76 y.o. female with a history of palpitations and IBS who is followed y Dr. Percival Spanish and presents today for follow-up of palpitations.  Patient was referred to Dr. Percival Spanish in 02/2022 for further evaluation of palpitations. Event monitor was ordered for further evaluation and showed underlying sinus rhythm with one 5 beat run of NSVT but no significant arrhythmias. Her BP was noted to be mildly elevated but patient reported that it was usually well controlled so she was not started on any medications for this.   Patient presents today for follow-up. Here alone. Reviewed monitor results. She is still having intermittent palpitations. She describes some very strong heart beats which sound like premature beats and then some short episodes of heart racing which may be her NSVT. However, she also describes a couple of longer episodes of heart racing which did not occur while she was wearing the heart monitor. Longest episode lasted about 5 minutes and she stated she felt very fatigued and "wiped out" with this. No lightheadedness, dizziness, or syncope. No chest pain, shortness of breath, orthopnea, or PND.  Past Medical History:  Diagnosis Date   Diverticulitis    Headache    IBS (irritable bowel syndrome)    Osteoporosis     Past Surgical History:  Procedure Laterality Date   BREAST BIOPSY Right    benign   TONSILLECTOMY AND ADENOIDECTOMY      Current Medications: Current Meds  Medication Sig   ACETAMINOPHEN PO Take 500 mg by mouth as needed.   ALPRAZolam (XANAX XR) 0.5 MG 24 hr tablet Take 0.5 mg by mouth daily.   CALCIUM CITRATE PO Take by mouth.   cholecalciferol  (VITAMIN D) 1000 UNITS tablet Take 2,000 Units by mouth every morning.   COVID-19 mRNA vaccine 2023-2024 (COMIRNATY) syringe Inject into the muscle.   fexofenadine (ALLEGRA) 180 MG tablet Take 180 mg by mouth as needed for allergies or rhinitis.   fluocinonide ointment (LIDEX) 0.93 % Apply 1 Application topically 2 (two) times daily.   influenza vaccine adjuvanted (FLUAD) 0.5 ML injection Inject into the muscle.   loratadine (CLARITIN) 10 MG tablet Take 1 tablet by mouth daily.   Multiple Vitamin (MULTIVITAMIN) tablet Take 1 tablet by mouth every morning.    OVER THE COUNTER MEDICATION FD Guard (caraway oil and menthol) one before meals as needed)   Polyethylene Glycol 3350 (MIRALAX PO) Miralax   raloxifene (EVISTA) 60 MG tablet Take 60 mg by mouth every morning.    Simethicone (GAS-X PO) Take by mouth.     Allergies:   Penicillins and Pneumococcal vaccines   Social History   Socioeconomic History   Marital status: Married    Spouse name: Not on file   Number of children: Not on file   Years of education: Not on file   Highest education level: Not on file  Occupational History   Not on file  Tobacco Use   Smoking status: Never    Passive exposure: Never   Smokeless tobacco: Never  Vaping Use   Vaping Use: Never used  Substance and Sexual Activity   Alcohol use: Yes  Comment: Rare   Drug use: No   Sexual activity: Yes  Other Topics Concern   Not on file  Social History Narrative   Married.  Lives at home with husband.  3 children and 6 grand.     Retired Agricultural engineer   Education: Surveyor, mining.   Caffeine: coke on rare basis, hot tea once in while.   Social Determinants of Health   Financial Resource Strain: Not on file  Food Insecurity: Not on file  Transportation Needs: Not on file  Physical Activity: Not on file  Stress: Not on file  Social Connections: Not on file     Family History: The patient's family history includes Heart failure in her father; Lymphoma  in her mother. There is no history of Migraines.  ROS:   Please see the history of present illness.     EKGs/Labs/Other Studies Reviewed:    The following studies were reviewed:  Event Monitor 02/25/2022 to 03/26/2022: Normal sinus rhythm One run of NSVT 5 beats No sustained arrhythmias No symptoms correlate with arrhythmia  EKG:  EKG not ordered today.   Recent Labs: 02/13/2022: BUN 16; Creatinine, Ser 0.69; Hemoglobin 13.8; Magnesium 2.0; Platelets 343; Potassium 4.2; Sodium 133  Recent Lipid Panel No results found for: "CHOL", "TRIG", "HDL", "CHOLHDL", "VLDL", "LDLCALC", "LDLDIRECT"  Physical Exam:    Vital Signs: BP 134/80   Pulse 85   Ht 5' 5.5" (1.664 m)   Wt 114 lb (51.7 kg)   SpO2 97%   BMI 18.68 kg/m     Wt Readings from Last 3 Encounters:  04/06/22 114 lb (51.7 kg)  02/17/22 112 lb 6.4 oz (51 kg)  02/16/22 113 lb 12.8 oz (51.6 kg)     General: 76 y.o. thin Caucasian female in no acute distress. HEENT: Normocephalic and atraumatic. Sclera clear.  Neck: Supple. No JVD. Heart: RRR. Distinct S1 and S2. No murmurs, gallops, or rubs. Radial pulses 2+ and equal bilaterally. Lungs: No increased work of breathing. Clear to ausculation bilaterally. No wheezes, rhonchi, or rales.  Extremities: Trace lower extremity edema bilaterally.    Skin: Warm and dry. Neuro: Alert and oriented x3. No focal deficits. Psych: Normal affect. Responds appropriately.  Assessment:    1. Palpitations   2. Borderline hypertension     Plan:    Palpitations Recent monitor in 02/2022 showed underlying sinus rhythm with one 5 beat run of NSVT but no significant arrhythmias. She continues to have intermittent palpitations. Most of these are very short lived; however, she did have one 5 minute episode of heart racing after she returned her monitor and felt incredibly "wiped out" with this. Offer to prescribe a beta-blocker to see if that would help but she declined. Recommended the [facility] device to help monitor palpitations. Advised her to let us know if she has any recurrent episode or prolonged palpitations. If this become more frequent, would recommended adding a beta-blocker.  Borderline Hypertension BP 134/80 in the office today. Advised patient to continue to monitor BP at home. Discussed that BP goal is <130/80. If she notices BP continues to rise and is consistently above this, asked her to let us know. I would likely add a beta-blocker given palpitations.   Disposition: Follow up in 6 months.   Medication Adjustments/Labs and Tests Ordered: Current medicines are reviewed at length with the patient today.  Concerns regarding medicines are outlined above.  No orders of the defined types were placed in this encounter.  No orders  of the defined types were placed in this encounter.   Patient Instructions  Medication Instructions:  The current medical regimen is effective;  continue present plan and medications as directed. Please refer to the Current Medication list given to you today.  *If you need a refill on your cardiac medications before your next appointment, please call your pharmacy*   Lab Work: NONE If you have labs (blood work) drawn today and your tests are completely normal, you will receive your results only by: [place] (if you have MyChart) OR A paper copy in the mail If you have any lab test that is abnormal or we need to change your treatment, we will call you to review the results.   Testing/Procedures: NONE   Follow-Up: At [facility], you and your health needs are our priority.  As part of our continuing mission to provide you with exceptional heart care, we have created designated Provider Care Teams.  These Care Teams include your primary Cardiologist (physician) and Advanced Practice Providers (APPs -  Physician Assistants and Nurse Practitioners) who all work together to provide you with the care you need, when you  need it.  We recommend signing up for the patient portal called "MyChart".  Sign up information is provided on this After Visit Summary.  MyChart is used to connect with patients for Virtual Visits (Telemedicine).  Patients are able to view lab/test results, encounter notes, upcoming appointments, etc.  Non-urgent messages can be sent to your provider as well.   To learn more about what you can do with MyChart, go to [email or web address].    Your next appointment:   6 month(s)  The format for your next appointment:   In Person  Provider:   Minus Breeding, MD  or Sande Rives, PA-C        Other Instructions [place]  IF YOUR PALPITATIONS GET [place] TO DISCUSS  Important Information About Sugar         Liane Comber, PA-C  04/07/2022 9:00 AM    [place]

## 2022-04-03 ENCOUNTER — Other Ambulatory Visit (HOSPITAL_BASED_OUTPATIENT_CLINIC_OR_DEPARTMENT_OTHER): Payer: Self-pay

## 2022-04-03 MED ORDER — INFLUENZA VAC A&B SA ADJ QUAD 0.5 ML IM PRSY
PREFILLED_SYRINGE | INTRAMUSCULAR | 0 refills | Status: DC
  Filled 2022-04-03: qty 0.5, 1d supply, fill #0

## 2022-04-03 MED ORDER — COMIRNATY 30 MCG/0.3ML IM SUSY
PREFILLED_SYRINGE | INTRAMUSCULAR | 0 refills | Status: DC
  Filled 2022-04-03: qty 0.3, 1d supply, fill #0

## 2022-04-04 ENCOUNTER — Ambulatory Visit: Payer: Medicare HMO | Attending: Neurology | Admitting: Physical Therapy

## 2022-04-04 ENCOUNTER — Telehealth: Payer: Self-pay | Admitting: Physical Therapy

## 2022-04-04 DIAGNOSIS — M5481 Occipital neuralgia: Secondary | ICD-10-CM | POA: Diagnosis not present

## 2022-04-04 DIAGNOSIS — M6281 Muscle weakness (generalized): Secondary | ICD-10-CM

## 2022-04-04 NOTE — Telephone Encounter (Signed)
Please place order thank you

## 2022-04-04 NOTE — Therapy (Signed)
OUTPATIENT PHYSICAL THERAPY CERVICAL EVALUATION   Patient Name: Julia Singleton MRN: [id number] DOB:1945/07/25, 76 y.o., female Today's Date: 04/04/2022   PT End of Session - 04/04/22 1019     Visit Number 1    Number of Visits 9   Plus eval   Date for PT Re-Evaluation 05/09/22    Authorization Type [employer]    PT Start Time 1019    PT Stop Time 1055    PT Time Calculation (min) 36 min    Activity Tolerance Patient tolerated treatment well    Behavior During Therapy WFL for tasks assessed/performed             Past Medical History:  Diagnosis Date   Diverticulitis    Headache    IBS (irritable bowel syndrome)    Osteoporosis    Past Surgical History:  Procedure Laterality Date   BREAST BIOPSY Right    benign   TONSILLECTOMY AND ADENOIDECTOMY     Patient Active Problem List   Diagnosis Date Noted   Bilateral occipital neuralgia 02/20/2022   Palpitations 02/15/2022   Essential hypertension 02/15/2022    PCP: Matthew Folks, MD  REFERRING PROVIDER: Melvenia Beam, MD   REFERRING DIAG: [phone] (ICD-10-CM) - Bilateral occipital neuralgia   THERAPY DIAG:  Bilateral occipital neuralgia  Muscle weakness (generalized)  Rationale for Evaluation and Treatment: Rehabilitation  ONSET DATE: July 31st   SUBJECTIVE:                                                                                                                                                                                                         SUBJECTIVE STATEMENT: Pt reports her nerve block lasted <1 day. Pt reported things started on July 31st of this year and she had no history of headaches/TBI. Is not sleeping well, but is baseline for her. Averages about 6-7 hours total per night but usually in 67mn-1hr increments. Pt inquiring about receiving PT for her R shoulder as well, has chronic shoulder pain. Received injection to shoulder 511mogo but was not very helpful. No falls  PERTINENT  HISTORY:  HTN, osteoporosis, headache   PAIN:  Are you having pain? Yes: NPRS scale: 3/10 Pain location: Occipital headache  Pain description: achy Aggravating factors: reports no consistency Relieving factors: Nothing  PRECAUTIONS: Fall  WEIGHT BEARING RESTRICTIONS: No  FALLS:  Has patient fallen in last 6 months? No  LIVING ENVIRONMENT: Lives with: lives with their spouse Lives in: House/apartment Stairs: Yes: External: 2 steps; none Has following equipment at home: None  OCCUPATION:  Housewife   PLOF: Independent  PATIENT GOALS: "to get rid of this headache"   OBJECTIVE:   DIAGNOSTIC FINDINGS:  MRI brain 01/26/2022: 1. No acute intracranial abnormality. No explanation for acute headache. 2. Mild for age cerebral white matter signal changes, nonspecific but most commonly due to chronic small vessel disease. 3. Mild chronic paranasal sinus inflammation, significance doubtful.  PATIENT SURVEYS:  NDI 12/50 (24%- mild disability)  COGNITION: Overall cognitive status: Within functional limits for tasks assessed  SENSATION: WFL  POSTURE: rounded shoulders and increased thoracic kyphosis  PALPATION: Very tender/tight on bilateral upper traps    CERVICAL ROM: Tested in seated position   Active ROM A/PROM (deg) eval  Flexion 43  Extension 45  Right lateral flexion 26  Left lateral flexion 15  Right rotation 50  Left rotation 50   (Blank rows = not tested)  UPPER EXTREMITY ROM: Tested in seated position. All WFL. Noted pain w/IR and flexion on R side   Active ROM Right eval Left eval  Shoulder flexion    Shoulder extension    Shoulder abduction    Shoulder adduction    Shoulder extension    Shoulder internal rotation    Shoulder external rotation    Elbow flexion    Elbow extension    Wrist flexion    Wrist extension    Wrist ulnar deviation    Wrist radial deviation    Wrist pronation    Wrist supination     (Blank rows = not  tested)   TODAY'S TREATMENT:   Next Session                                                                                                                               PATIENT EDUCATION:  Education details: POC, eval findings, dry needling intro Person educated: Patient Education method: Consulting civil engineer, Demonstration, Tactile cues, and Verbal cues Education comprehension: verbalized understanding and needs further education  HOME EXERCISE PROGRAM: To be established   ASSESSMENT:  CLINICAL IMPRESSION: Patient is a 76 year old female referred to Neuro OPPT for bilateral occipital neuralgia. Pt's PMH is significant for: HTN, osteoporosis, headache. The following deficits were present during the exam: decreased cervical ROM. Noted significant tightness of upper traps bilaterally and R rotation preference. Pt would benefit from skilled PT to address these impairments and functional limitations to maximize functional mobility independence.     OBJECTIVE IMPAIRMENTS: decreased ROM, decreased strength, and pain.   ACTIVITY LIMITATIONS: sleeping, reach over head, and caring for others  PARTICIPATION LIMITATIONS: community activity and yard work  PERSONAL FACTORS: Age and Fitness are also affecting patient's functional outcome.   REHAB POTENTIAL: Good  CLINICAL DECISION MAKING: Stable/uncomplicated  EVALUATION COMPLEXITY: Low   GOALS: Goals reviewed with patient? Yes  SHORT TERM GOALS= LONG TERM GOALS DUE TO POC LENGTH: Target date: 05/02/2022   Pt will be independent with final HEP for improved strength, balance, transfers and gait.  Baseline:  not established on eval  Goal status: INITIAL  2.  Pt will improve NDI to </=5/50 for reduced pain levels and perceived functional ability  Baseline: 12/50 Goal status: INITIAL  3.  Pt will improve left lateral cervical flexion by 10 degrees for improved ROM and functional use of paraspinals   Baseline: 15 degrees  Goal status:  INITIAL   PLAN:  PT FREQUENCY: 2x/week  PT DURATION: 4 weeks  PLANNED INTERVENTIONS: Therapeutic exercises, Therapeutic activity, Neuromuscular re-education, Balance training, Gait training, Patient/Family education, Self Care, Joint mobilization, Joint manipulation, Aquatic Therapy, Dry Needling, Cryotherapy, Moist heat, Manual therapy, and Re-evaluation  PLAN FOR NEXT SESSION: dry needling to paraspinals/upper traps/suboccipitals. Establish HEP for periscapular strength and cervical ROM    Julia Singleton, PT, DPT 04/04/2022, 10:58 AM

## 2022-04-04 NOTE — Addendum Note (Signed)
Addended by: Brandon Melnick on: 04/04/2022 03:27 PM   Modules accepted: Orders

## 2022-04-04 NOTE — Telephone Encounter (Signed)
Dr. Jaynee Eagles,  Onnie Boer Lumley was evaluated by PT on 04/04/22 for bilateral occipital neuralgia. The patient mentioned a history of R shoulder impingement and would like to address this in PT as well.   Can you send in a second referral for R shoulder impingement so PT can address this?   If you agree, please place an order in [facility] in [facility] or fax the order to [phone].  Thank you, Charlett Nose, PT, [place] [address] [place], [place]  [address] Phone:  [phone] Fax:  [phone]

## 2022-04-06 ENCOUNTER — Ambulatory Visit: Payer: Medicare HMO | Attending: Student | Admitting: Student

## 2022-04-06 ENCOUNTER — Encounter: Payer: Self-pay | Admitting: Student

## 2022-04-06 VITALS — BP 134/80 | HR 85 | Ht 65.5 in | Wt 114.0 lb

## 2022-04-06 DIAGNOSIS — R030 Elevated blood-pressure reading, without diagnosis of hypertension: Secondary | ICD-10-CM | POA: Diagnosis not present

## 2022-04-06 DIAGNOSIS — R002 Palpitations: Secondary | ICD-10-CM

## 2022-04-06 NOTE — Patient Instructions (Signed)
Medication Instructions:  The current medical regimen is effective;  continue present plan and medications as directed. Please refer to the Current Medication list given to you today.  *If you need a refill on your cardiac medications before your next appointment, please call your pharmacy*   Lab Work: NONE If you have labs (blood work) drawn today and your tests are completely normal, you will receive your results only by: [place] (if you have MyChart) OR A paper copy in the mail If you have any lab test that is abnormal or we need to change your treatment, we will call you to review the results.   Testing/Procedures: NONE   Follow-Up: At [facility], you and your health needs are our priority.  As part of our continuing mission to provide you with exceptional heart care, we have created designated Provider Care Teams.  These Care Teams include your primary Cardiologist (physician) and Advanced Practice Providers (APPs -  Physician Assistants and Nurse Practitioners) who all work together to provide you with the care you need, when you need it.  We recommend signing up for the patient portal called "MyChart".  Sign up information is provided on this After Visit Summary.  MyChart is used to connect with patients for Virtual Visits (Telemedicine).  Patients are able to view lab/test results, encounter notes, upcoming appointments, etc.  Non-urgent messages can be sent to your provider as well.   To learn more about what you can do with MyChart, go to [email or web address].    Your next appointment:   6 month(s)  The format for your next appointment:   In Person  Provider:   Minus Breeding, MD  or Sande Rives, PA-C        Other Instructions [employer]  IF YOUR PALPITATIONS GET WORSE OR INCREASE PLEASE CALL TO DISCUSS  Important Information About Sugar

## 2022-04-07 ENCOUNTER — Encounter: Payer: Self-pay | Admitting: Student

## 2022-04-07 ENCOUNTER — Ambulatory Visit: Payer: Medicare HMO | Attending: Neurology | Admitting: Physical Therapy

## 2022-04-07 DIAGNOSIS — M6281 Muscle weakness (generalized): Secondary | ICD-10-CM | POA: Diagnosis not present

## 2022-04-07 DIAGNOSIS — M5481 Occipital neuralgia: Secondary | ICD-10-CM | POA: Diagnosis not present

## 2022-04-07 DIAGNOSIS — M25511 Pain in right shoulder: Secondary | ICD-10-CM | POA: Diagnosis not present

## 2022-04-07 DIAGNOSIS — G8929 Other chronic pain: Secondary | ICD-10-CM | POA: Insufficient documentation

## 2022-04-07 NOTE — Therapy (Signed)
OUTPATIENT PHYSICAL THERAPY CERVICAL TREATMENT    Patient Name: Julia Singleton MRN: [id number] DOB:[birth year], 76 y.o., female Today's Date: 04/07/2022   PT End of Session - 04/07/22 0805     Visit Number 2    Number of Visits 9   Plus eval   Date for PT Re-Evaluation 05/09/22    Authorization Type [employer]    PT Start Time 0803    PT Stop Time 0842    PT Time Calculation (min) 39 min    Activity Tolerance Patient tolerated treatment well    Behavior During Therapy [facility] for tasks assessed/performed              Past Medical History:  Diagnosis Date   Diverticulitis    Headache    IBS (irritable bowel syndrome)    Osteoporosis    Past Surgical History:  Procedure Laterality Date   BREAST BIOPSY Right    benign   TONSILLECTOMY AND ADENOIDECTOMY     Patient Active Problem List   Diagnosis Date Noted   Bilateral occipital neuralgia 02/20/2022   Palpitations 02/15/2022   Essential hypertension 02/15/2022    PCP: Matthew Folks, MD  REFERRING PROVIDER: Melvenia Beam, MD   REFERRING DIAG: [id number] (ICD-10-CM) - Bilateral occipital neuralgia   THERAPY DIAG:  Bilateral occipital neuralgia  Muscle weakness (generalized)  Rationale for Evaluation and Treatment: Rehabilitation  ONSET DATE: July 31st   SUBJECTIVE:                                                                                                                                                                                                         SUBJECTIVE STATEMENT: Pt reports 3/10 pain, ongoing headaches and R shoulder pain. Pt reports she did have her covid and flu shots earlier this week and her RUE is still feeling sore from that.  PERTINENT HISTORY:  HTN, osteoporosis, headache   PAIN:  Are you having pain? Yes: NPRS scale: 3/10 Pain location: Occipital headache  Pain description: achy Aggravating factors: reports no consistency Relieving factors: Nothing  PRECAUTIONS:  Fall  WEIGHT BEARING RESTRICTIONS: No  FALLS:  Has patient fallen in last 6 months? No  LIVING ENVIRONMENT: Lives with: lives with their spouse Lives in: House/apartment Stairs: Yes: External: 2 steps; none Has following equipment at home: None  OCCUPATION: Housewife   PLOF: Independent  PATIENT GOALS: "to get rid of this headache"   OBJECTIVE:   DIAGNOSTIC FINDINGS:  MRI brain 01/26/2022: 1. No acute intracranial abnormality. No explanation for acute headache. 2. Mild for age cerebral  white matter signal changes, nonspecific but most commonly due to chronic small vessel disease. 3. Mild chronic paranasal sinus inflammation, significance doubtful.  PATIENT SURVEYS:  NDI 12/50 (24%- mild disability)  COGNITION: Overall cognitive status: Within functional limits for tasks assessed  SENSATION: WFL  POSTURE: rounded shoulders and increased thoracic kyphosis  PALPATION: Very tender/tight on bilateral upper traps     TODAY'S TREATMENT:    THER EX:   Initiated stretching for HEP, see bolded below   THER ACT:   Pt with palpable tightness in B UT and suboccipital muscles. TPDN as detailed below:  Trigger Point Dry-Needling  Treatment instructions: Expect mild to moderate muscle soreness. S/S of pneumothorax if dry needled over a lung field, and to seek immediate medical attention should they occur. Patient verbalized understanding of these instructions and education.  Patient Consent Given: Yes Education handout provided: Yes Muscles treated: L/R suboccipitals, L/R upper traps Treatment response/outcome: initial soreness at site of needling    Trigger Point Dry Needling  What is Trigger Point Dry Needling (DN)? DN is a physical therapy technique used to treat muscle pain and dysfunction. Specifically, DN helps deactivate muscle trigger points (muscle knots).  A thin filiform needle is used to penetrate the skin and stimulate the underlying trigger point. The  goal is for a local twitch response (LTR) to occur and for the trigger point to relax. No medication of any kind is injected during the procedure.   What Does Trigger Point Dry Needling Feel Like?  The procedure feels different for each individual patient. Some patients report that they do not actually feel the needle enter the skin and overall the process is not painful. Very mild bleeding may occur. However, many patients feel a deep cramping in the muscle in which the needle was inserted. This is the local twitch response.   How Will I feel after the treatment? Soreness is normal, and the onset of soreness may not occur for a few hours. Typically this soreness does not last longer than two days.  Bruising is uncommon, however; ice can be used to decrease any possible bruising.  In rare cases feeling tired or nauseous after the treatment is normal. In addition, your symptoms may get worse before they get better, this period will typically not last longer than 24 hours.   What Can I do After My Treatment? Increase your hydration by drinking more water for the next 24 hours. You may place ice or heat on the areas treated that have become sore, however, do not use heat on inflamed or bruised areas. Heat often brings more relief post needling. You can continue your regular activities, but vigorous activity is not recommended initially after the treatment for 24 hours. DN is best combined with other physical therapy such as strengthening, stretching, and other therapies.  PATIENT EDUCATION:  Education details: TPDN, initiated HEP Person educated: Patient Education method: Explanation, Demonstration, Tactile cues, Verbal cues, and Handouts Education comprehension: verbalized understanding and needs further education  HOME EXERCISE PROGRAM: Access Code: [email or web address] URL:  [email or web address] Date: 04/07/2022 Prepared by: Excell Seltzer  Exercises - Seated Upper Trapezius Stretch  - 1 x daily - 7 x weekly - 1 sets - 5 reps - 30-60 sec hold - Seated Cervical Flexion AROM  - 1 x daily - 7 x weekly - 1 sets - 5 reps - 30-60 sec hold  ASSESSMENT:  CLINICAL IMPRESSION: Emphasis of skilled PT session on performing dry needling to suboccipital muscles and B upper traps due to palpable tightness and trigger points in these areas as well as initiating HEP for neck/UT stretches for pain management. Pt has no increase in pain during treatment this date and reports some relief following dry needling and stretching. Pt continues to benefit from skilled therapy services to address ongoing occipital neuraglia as well as to address R shoulder impingement next session. Continue POC.     OBJECTIVE IMPAIRMENTS: decreased ROM, decreased strength, and pain.   ACTIVITY LIMITATIONS: sleeping, reach over head, and caring for others  PARTICIPATION LIMITATIONS: community activity and yard work  PERSONAL FACTORS: Age and Fitness are also affecting patient's functional outcome.   REHAB POTENTIAL: Good  CLINICAL DECISION MAKING: Stable/uncomplicated  EVALUATION COMPLEXITY: Low   GOALS: Goals reviewed with patient? Yes  SHORT TERM GOALS= LONG TERM GOALS DUE TO POC LENGTH: Target date: 05/02/2022   Pt will be independent with final HEP for improved strength, balance, transfers and gait.  Baseline: not established on eval  Goal status: INITIAL  2.  Pt will improve NDI to </=5/50 for reduced pain levels and perceived functional ability  Baseline: 12/50 Goal status: INITIAL  3.  Pt will improve left lateral cervical flexion by 10 degrees for improved ROM and functional use of paraspinals   Baseline: 15 degrees  Goal status: INITIAL   PLAN:  PT FREQUENCY: 2x/week  PT DURATION: 4 weeks  PLANNED INTERVENTIONS: Therapeutic exercises, Therapeutic  activity, Neuromuscular re-education, Balance training, Gait training, Patient/Family education, Self Care, Joint mobilization, Joint manipulation, Aquatic Therapy, Dry Needling, Cryotherapy, Moist heat, Manual therapy, and Re-evaluation  PLAN FOR NEXT SESSION: dry needling to paraspinals/upper traps/suboccipitals. Add to HEP for periscapular strength and cervical ROM, scap squeezes, assess R shoulder impingement    Excell Seltzer, PT, DPT, CSRS 04/07/2022, 8:43 AM

## 2022-04-10 ENCOUNTER — Ambulatory Visit: Payer: Medicare HMO | Admitting: Physical Therapy

## 2022-04-10 DIAGNOSIS — M5481 Occipital neuralgia: Secondary | ICD-10-CM

## 2022-04-10 DIAGNOSIS — G8929 Other chronic pain: Secondary | ICD-10-CM | POA: Diagnosis not present

## 2022-04-10 DIAGNOSIS — M6281 Muscle weakness (generalized): Secondary | ICD-10-CM

## 2022-04-10 DIAGNOSIS — M25511 Pain in right shoulder: Secondary | ICD-10-CM | POA: Diagnosis not present

## 2022-04-10 NOTE — Therapy (Signed)
OUTPATIENT PHYSICAL THERAPY NEURO TREATMENT- RECERTIFICATION    Patient Name: Julia Singleton MRN: [id number] DOB:Feb 16, 1946, 76 y.o., female Today's Date: 04/10/2022   PT End of Session - 04/10/22 0803     Visit Number 3    Number of Visits 9   Plus eval   Date for PT Re-Evaluation 05/09/22    Authorization Type [employer]    PT Start Time 0802    PT Stop Time 0845    PT Time Calculation (min) 43 min    Activity Tolerance Patient tolerated treatment well    Behavior During Therapy [facility] for tasks assessed/performed               Past Medical History:  Diagnosis Date   Diverticulitis    Headache    IBS (irritable bowel syndrome)    Osteoporosis    Past Surgical History:  Procedure Laterality Date   BREAST BIOPSY Right    benign   TONSILLECTOMY AND ADENOIDECTOMY     Patient Active Problem List   Diagnosis Date Noted   Bilateral occipital neuralgia 02/20/2022   Palpitations 02/15/2022   Essential hypertension 02/15/2022    PCP: Matthew Folks, MD  REFERRING PROVIDER: Melvenia Beam, MD   REFERRING DIAG: [phone] (ICD-10-CM) - Bilateral occipital neuralgia;  M62.81 (ICD-10-CM) - Muscle weakness (generalized); M25.819 (ICD-10-CM) - Shoulder impingement  THERAPY DIAG:  Bilateral occipital neuralgia  Muscle weakness (generalized)  Chronic right shoulder pain  Rationale for Evaluation and Treatment: Rehabilitation  ONSET DATE: July 31st   SUBJECTIVE:                                                                                                                                                                                                         SUBJECTIVE STATEMENT: Pt reports 2/10 pain in head today, not sure if the dry needling helped. R shoulder is still very irritated, pt thinking it is due to her vaccinations last week.   PERTINENT HISTORY:  HTN, osteoporosis, headache   PAIN:  Are you having pain? Yes: NPRS scale: 2-3/10 Pain location:  Occipital headache and R shoulder  Pain description: achy Aggravating factors: reports no consistency Relieving factors: Nothing  PRECAUTIONS: Fall  WEIGHT BEARING RESTRICTIONS: No  FALLS:  Has patient fallen in last 6 months? No  LIVING ENVIRONMENT: Lives with: lives with their spouse Lives in: House/apartment Stairs: Yes: External: 2 steps; none Has following equipment at home: None  OCCUPATION: Housewife   PLOF: Independent  PATIENT GOALS: "to get rid of this headache"   OBJECTIVE:   DIAGNOSTIC  FINDINGS:  MRI brain 01/26/2022: 1. No acute intracranial abnormality. No explanation for acute headache. 2. Mild for age cerebral white matter signal changes, nonspecific but most commonly due to chronic small vessel disease. 3. Mild chronic paranasal sinus inflammation, significance doubtful.  PATIENT SURVEYS:  NDI 12/50 (24%- mild disability)  COGNITION: Overall cognitive status: Within functional limits for tasks assessed  SENSATION: WFL  POSTURE: rounded shoulders and increased thoracic kyphosis  PALPATION: Very tender/tight on bilateral upper traps     TODAY'S TREATMENT:    Ther Ex The following were performed for improved cervical ROM, periscapular strength and spinal mobility:  Seated Chin Tuck, x10 reps w/2-3s isometric hold Seated Shoulder Row with Anchored Resistance (red band), x15 reps w/ 2-3 second isometric hold. Min cues to reduce shoulder shrug compensation.  Plank with Thoracic Rotation on Counter, x10 reps per side. Pt w/increased R shoulder pain when performing on R side, so instructed pt to perform movement in pain-free ROM only Plank on Table with Scapular Protraction Retraction,x15 reps w/2-3 second hold. Min cues to minimize elbow flexion throughout.   Educated pt on downloading [place] to access her HEP on her phone/ipad and walked pt through steps. Pt verbalized understanding.                                                                                 PATIENT EDUCATION:  Education details: Updates to HEP Person educated: Patient Education method: Consulting civil engineer, Media planner, Corporate treasurer cues, Verbal cues, and Handouts Education comprehension: verbalized understanding and needs further education  HOME EXERCISE PROGRAM: Access Code: [email or web address] URL: [email or web address] Date: 04/07/2022 Prepared by: Excell Seltzer  Exercises - Seated Upper Trapezius Stretch  - 1 x daily - 7 x weekly - 1 sets - 5 reps - 30-60 sec hold - Seated Cervical Flexion AROM  - 1 x daily - 7 x weekly - 1 sets - 5 reps - 30-60 sec hold - Chin Tuck  - 1 x daily - 7 x weekly - 3 sets - 10 reps - 5 second hold - Seated Shoulder Row with Anchored Resistance  - 1 x daily - 7 x weekly - 3 sets - 15-20 reps - 2-3 second  hold - Plank with Thoracic Rotation on Counter  - 1 x daily - 7 x weekly - 3 sets - 10 reps - Plank on Table with Scapular Protraction Retraction  - 1 x daily - 7 x weekly - 3 sets - 10 reps - 2-3 second hold  ASSESSMENT:  CLINICAL IMPRESSION: Emphasis of skilled PT session on updating HEP to incorporate dynamic stretches and periscapular strengthening. Pt required min cues to minimize shoulder shrug compensation w/exercises but otherwise tolerated well. Received new referral codes to address R shoulder impingement and global deconditioning, so will send recert today to address in future sessions. Continue POC.      OBJECTIVE IMPAIRMENTS: decreased ROM, decreased strength, and pain.   ACTIVITY LIMITATIONS: sleeping, reach over head, and caring for others  PARTICIPATION LIMITATIONS: community activity and yard work  PERSONAL FACTORS: Age and Fitness are also affecting patient's functional outcome.   REHAB POTENTIAL: Good  CLINICAL DECISION MAKING: Stable/uncomplicated  EVALUATION COMPLEXITY: Low   GOALS: Goals  reviewed with patient? Yes  SHORT TERM GOALS= LONG TERM GOALS DUE TO POC LENGTH: Target date: 05/02/2022    Pt will be independent with final HEP for improved strength, balance, transfers and gait.  Baseline: not established on eval  Goal status: INITIAL  2.  Pt will improve NDI to </=5/50 for reduced pain levels and perceived functional ability  Baseline: 12/50 Goal status: INITIAL  3.  Pt will improve left lateral cervical flexion by 10 degrees for improved ROM and functional use of paraspinals   Baseline: 15 degrees  Goal status: INITIAL   PLAN:  PT FREQUENCY: 2x/week  PT DURATION: 4 weeks  PLANNED INTERVENTIONS: Therapeutic exercises, Therapeutic activity, Neuromuscular re-education, Balance training, Gait training, Patient/Family education, Self Care, Joint mobilization, Joint manipulation, Aquatic Therapy, Dry Needling, Cryotherapy, Moist heat, Manual therapy, and Re-evaluation  PLAN FOR NEXT SESSION: dry needling to paraspinals/upper traps/suboccipitals. Add to HEP for periscapular strength and cervical ROM, scap squeezes, assess R shoulder impingement, I, Y, T, quadruped    Kristyna Bradstreet E Tyashia Morrisette, PT, DPT 04/10/2022, 8:46 AM

## 2022-04-11 DIAGNOSIS — R110 Nausea: Secondary | ICD-10-CM | POA: Diagnosis not present

## 2022-04-11 DIAGNOSIS — R140 Abdominal distension (gaseous): Secondary | ICD-10-CM | POA: Diagnosis not present

## 2022-04-12 ENCOUNTER — Ambulatory Visit: Payer: Medicare HMO | Admitting: Physical Therapy

## 2022-04-12 DIAGNOSIS — M5481 Occipital neuralgia: Secondary | ICD-10-CM

## 2022-04-12 DIAGNOSIS — M6281 Muscle weakness (generalized): Secondary | ICD-10-CM | POA: Diagnosis not present

## 2022-04-12 DIAGNOSIS — M25511 Pain in right shoulder: Secondary | ICD-10-CM | POA: Diagnosis not present

## 2022-04-12 DIAGNOSIS — G8929 Other chronic pain: Secondary | ICD-10-CM

## 2022-04-12 NOTE — Therapy (Signed)
OUTPATIENT PHYSICAL THERAPY NEURO TREATMENT    Patient Name: Julia Singleton MRN: [id number] DOB:1945/11/13, 76 y.o., female Today's Date: 04/12/2022   PT End of Session - 04/12/22 0803     Visit Number 4    Number of Visits 9   Plus eval   Date for PT Re-Evaluation 05/09/22    Authorization Type [employer]    PT Start Time 0802    PT Stop Time 0845    PT Time Calculation (min) 43 min    Activity Tolerance Patient tolerated treatment well    Behavior During Therapy [facility] for tasks assessed/performed                Past Medical History:  Diagnosis Date   Diverticulitis    Headache    IBS (irritable bowel syndrome)    Osteoporosis    Past Surgical History:  Procedure Laterality Date   BREAST BIOPSY Right    benign   TONSILLECTOMY AND ADENOIDECTOMY     Patient Active Problem List   Diagnosis Date Noted   Bilateral occipital neuralgia 02/20/2022   Palpitations 02/15/2022   Essential hypertension 02/15/2022    PCP: Matthew Folks, MD  REFERRING PROVIDER: Melvenia Beam, MD   REFERRING DIAG: [phone] (ICD-10-CM) - Bilateral occipital neuralgia;  M62.81 (ICD-10-CM) - Muscle weakness (generalized); M25.819 (ICD-10-CM) - Shoulder impingement  THERAPY DIAG:  Chronic right shoulder pain  Bilateral occipital neuralgia  Muscle weakness (generalized)  Rationale for Evaluation and Treatment: Rehabilitation  ONSET DATE: July 31st   SUBJECTIVE:                                                                                                                                                                                                         SUBJECTIVE STATEMENT: Pt reports her R shoulder continues to bother her but heat seems to help.  Rated pain in R shoulder as 5/10, head is not hurting as much. HEP is going well.   PERTINENT HISTORY:  HTN, osteoporosis, headache   PAIN:  Are you having pain? Yes: NPRS scale: 1.5-2/10 Pain location: Occipital headache and R  shoulder  Pain description: achy Aggravating factors: reports no consistency Relieving factors: Nothing  PRECAUTIONS: Fall  WEIGHT BEARING RESTRICTIONS: No  FALLS:  Has patient fallen in last 6 months? No  LIVING ENVIRONMENT: Lives with: lives with their spouse Lives in: House/apartment Stairs: Yes: External: 2 steps; none Has following equipment at home: None  OCCUPATION: Housewife   PLOF: Independent  PATIENT GOALS: "to get rid of this headache"   OBJECTIVE:  DIAGNOSTIC FINDINGS:  MRI brain 01/26/2022: 1. No acute intracranial abnormality. No explanation for acute headache. 2. Mild for age cerebral white matter signal changes, nonspecific but most commonly due to chronic small vessel disease. 3. Mild chronic paranasal sinus inflammation, significance doubtful.  PATIENT SURVEYS:  NDI 12/50 (24%- mild disability)  COGNITION: Overall cognitive status: Within functional limits for tasks assessed  SENSATION: WFL  POSTURE: rounded shoulders and increased thoracic kyphosis  PALPATION: Very tender/tight on bilateral upper traps     TODAY'S TREATMENT:    Ther Ex SciFit multi-peaks level 6 for 8 minutes using BUE/BLEs for neural priming for reciprocal movement, dynamic cardiovascular warmup and global strengthening. Min cues to maintain steps/min > 75.  Standing I, Y, Ts for improved periscapular strength and shoulder ROM, x10 each. Progressed to holding 2# Dbs, x10 each.  Standing ER/IR against wall, x8 each direction. Pt very limited w/ER and IR on R side w/pain. Added posterior glide and pt reported decrease in pain, no added ROM noted. Added to HEP (see bolded below)   Manual therapy  Posterior shoulder glides of RUE w/flexion in scapular plane Manual ER/IR w/posterior glide in 90 degrees of abduction of R shoulder  Noted atrophy of R supraspinatus compared to L side. Pt denies pain w/palpation but has decreased abduction strength.                                                                                   PATIENT EDUCATION:  Education details: Updates to HEP Person educated: Patient Education method: Consulting civil engineer, Media planner, Corporate treasurer cues, Verbal cues, and Handouts Education comprehension: verbalized understanding and needs further education  HOME EXERCISE PROGRAM: Access Code: [email or web address] URL: [email or web address] Date: 04/07/2022 Prepared by: Excell Seltzer  Exercises - Seated Upper Trapezius Stretch  - 1 x daily - 7 x weekly - 1 sets - 5 reps - 30-60 sec hold - Seated Cervical Flexion AROM  - 1 x daily - 7 x weekly - 1 sets - 5 reps - 30-60 sec hold - Chin Tuck  - 1 x daily - 7 x weekly - 3 sets - 10 reps - 5 second hold - Seated Shoulder Row with Anchored Resistance  - 1 x daily - 7 x weekly - 3 sets - 15-20 reps - 2-3 second  hold - Plank with Thoracic Rotation on Counter  - 1 x daily - 7 x weekly - 3 sets - 10 reps - Plank on Table with Scapular Protraction Retraction  - 1 x daily - 7 x weekly - 3 sets - 10 reps - 2-3 second hold - Shoulder Internal and External Rotation in Abduction  - 1 x daily - 7 x weekly - 3 sets - 10 reps  ASSESSMENT:  CLINICAL IMPRESSION: Emphasis of skilled PT session on improved shoulder ROM, pain modulation and global strengthening. Pt very limited w/ER/IR of R shoulder but did improve w/stretching and posterior glides. Pt did report decrease in pain by end of session. Pt continues to rely heavily on compensation strategies to move R shoulder, but can correct w/verbal cues. Continue POC.     OBJECTIVE IMPAIRMENTS: decreased ROM, decreased strength, and pain.   ACTIVITY LIMITATIONS: sleeping,  reach over head, and caring for others  PARTICIPATION LIMITATIONS: community activity and yard work  PERSONAL FACTORS: Age and Fitness are also affecting patient's functional outcome.   REHAB POTENTIAL: Good  CLINICAL DECISION MAKING: Stable/uncomplicated  EVALUATION COMPLEXITY:  Low   GOALS: Goals reviewed with patient? Yes  SHORT TERM GOALS= LONG TERM GOALS DUE TO POC LENGTH: Target date: 05/02/2022   Pt will be independent with final HEP for improved strength, balance, transfers and gait.  Baseline: not established on eval  Goal status: INITIAL  2.  Pt will improve NDI to </=5/50 for reduced pain levels and perceived functional ability  Baseline: 12/50 Goal status: INITIAL  3.  Pt will improve left lateral cervical flexion by 10 degrees for improved ROM and functional use of paraspinals   Baseline: 15 degrees  Goal status: INITIAL   PLAN:  PT FREQUENCY: 2x/week  PT DURATION: 4 weeks  PLANNED INTERVENTIONS: Therapeutic exercises, Therapeutic activity, Neuromuscular re-education, Balance training, Gait training, Patient/Family education, Self Care, Joint mobilization, Joint manipulation, Aquatic Therapy, Dry Needling, Cryotherapy, Moist heat, Manual therapy, and Re-evaluation  PLAN FOR NEXT SESSION: dry needling to paraspinals/upper traps/suboccipitals. Add to HEP for periscapular strength and cervical ROM, scap squeezes, assess R shoulder impingement, quadruped    Nykolas Bacallao E Brynlyn Dade, PT, DPT 04/12/2022, 8:48 AM

## 2022-04-19 ENCOUNTER — Ambulatory Visit: Payer: Medicare HMO | Admitting: Physical Therapy

## 2022-04-19 DIAGNOSIS — M6281 Muscle weakness (generalized): Secondary | ICD-10-CM | POA: Diagnosis not present

## 2022-04-19 DIAGNOSIS — G8929 Other chronic pain: Secondary | ICD-10-CM | POA: Diagnosis not present

## 2022-04-19 DIAGNOSIS — M25511 Pain in right shoulder: Secondary | ICD-10-CM | POA: Diagnosis not present

## 2022-04-19 DIAGNOSIS — M5481 Occipital neuralgia: Secondary | ICD-10-CM | POA: Diagnosis not present

## 2022-04-19 NOTE — Therapy (Signed)
OUTPATIENT PHYSICAL THERAPY NEURO TREATMENT    Patient Name: Julia Singleton MRN: [id number] DOB:April 08, 1946, 76 y.o., female Today's Date: 04/19/2022   PT End of Session - 04/19/22 0846     Visit Number 5    Number of Visits 9   Plus eval   Date for PT Re-Evaluation 05/09/22    Authorization Type [employer]    PT Start Time 0845    PT Stop Time 0925    PT Time Calculation (min) 40 min    Activity Tolerance Patient tolerated treatment well    Behavior During Therapy [facility] for tasks assessed/performed                 Past Medical History:  Diagnosis Date   Diverticulitis    Headache    IBS (irritable bowel syndrome)    Osteoporosis    Past Surgical History:  Procedure Laterality Date   BREAST BIOPSY Right    benign   TONSILLECTOMY AND ADENOIDECTOMY     Patient Active Problem List   Diagnosis Date Noted   Bilateral occipital neuralgia 02/20/2022   Palpitations 02/15/2022   Essential hypertension 02/15/2022    PCP: Matthew Folks, MD  REFERRING PROVIDER: Melvenia Beam, MD   REFERRING DIAG: [id number] (ICD-10-CM) - Bilateral occipital neuralgia;  M62.81 (ICD-10-CM) - Muscle weakness (generalized); M25.819 (ICD-10-CM) - Shoulder impingement  THERAPY DIAG:  Bilateral occipital neuralgia  Muscle weakness (generalized)  Chronic right shoulder pain  Rationale for Evaluation and Treatment: Rehabilitation  ONSET DATE: July 31st   SUBJECTIVE:                                                                                                                                                                                                         SUBJECTIVE STATEMENT: Pt reports she feels that overall her pain has been better. Pt rates her headache pain as 1.5-2 and reports that it isn't very noticeable unless she focuses on it. Pt rates her R shoulder pain as 3-4. Pt reports she is sore from doing her exercises so it can be difficult to differentiate between muscle  soreness and her usual pain. Pt reports she has not been able to do the shoulder IR/ER exercise added last session due to pain and difficulty performing this exercise.  PERTINENT HISTORY:  HTN, osteoporosis, headache   PAIN:  Are you having pain? Yes: NPRS scale: 1.5-2/10 Pain location: Occipital headache and R shoulder  Pain description: achy Aggravating factors: reports no consistency Relieving factors: Nothing  PRECAUTIONS: Fall  WEIGHT BEARING RESTRICTIONS: No  FALLS:  Has patient fallen in last 6 months? No  LIVING ENVIRONMENT: Lives with: lives with their spouse Lives in: House/apartment Stairs: Yes: External: 2 steps; none Has following equipment at home: None  OCCUPATION: Housewife   PLOF: Independent  PATIENT GOALS: "to get rid of this headache"   OBJECTIVE:   DIAGNOSTIC FINDINGS:  MRI brain 01/26/2022: 1. No acute intracranial abnormality. No explanation for acute headache. 2. Mild for age cerebral white matter signal changes, nonspecific but most commonly due to chronic small vessel disease. 3. Mild chronic paranasal sinus inflammation, significance doubtful.  PATIENT SURVEYS:  NDI 12/50 (24%- mild disability)  COGNITION: Overall cognitive status: Within functional limits for tasks assessed  SENSATION: WFL  POSTURE: rounded shoulders and increased thoracic kyphosis  PALPATION: Very tender/tight on bilateral upper traps     TODAY'S TREATMENT:    Ther Ex Adjusted standing ER/IR exercise to supine due to pain and difficulty in standing position 3 x 10 reps Supine shoulder abduction AROM 2 x 10 reps Seated shoulder abduction towel slides 3 x 10 reps  Manual therapy  Manual ER/IR w/posterior glide in 90 degrees of abduction of R shoulder   THERE ACT: Trigger Point Dry-Needling  Treatment instructions: Expect mild to moderate muscle soreness. S/S of pneumothorax if dry needled over a lung field, and to seek immediate medical attention should they  occur. Patient verbalized understanding of these instructions and education.  Patient Consent Given: Yes Education handout provided: Previously provided Muscles treated: L and R suboccipitals, R UT Treatment response/outcome: muscle twitch detected, pt reports deep muscle ache                                                                                 PATIENT EDUCATION:  Education details: Updates to HEP Person educated: Patient Education method: Consulting civil engineer, Demonstration, Corporate treasurer cues, Verbal cues, and Handouts Education comprehension: verbalized understanding and needs further education  HOME EXERCISE PROGRAM: Access Code: [email or web address] URL: [email or web address] Date: 04/07/2022 Prepared by: Excell Seltzer  Exercises - Seated Upper Trapezius Stretch  - 1 x daily - 7 x weekly - 1 sets - 5 reps - 30-60 sec hold - Seated Cervical Flexion AROM  - 1 x daily - 7 x weekly - 1 sets - 5 reps - 30-60 sec hold - Chin Tuck  - 1 x daily - 7 x weekly - 3 sets - 10 reps - 5 second hold - Seated Shoulder Row with Anchored Resistance  - 1 x daily - 7 x weekly - 3 sets - 15-20 reps - 2-3 second  hold - Plank with Thoracic Rotation on Counter  - 1 x daily - 7 x weekly - 3 sets - 10 reps - Plank on Table with Scapular Protraction Retraction  - 1 x daily - 7 x weekly - 3 sets - 10 reps - 2-3 second hold - Shoulder Internal and External Rotation in Abduction  - 1 x daily - 7 x weekly - 3 sets - 10 reps - Seated Shoulder Abduction Towel Slide at Table Top  - 1 x daily - 7 x weekly - 3 sets - 10 reps - 5 hold   ASSESSMENT:  CLINICAL IMPRESSION: Emphasis of skilled  PT session on performing TPDN to address ongoing muscle tightness, adjusting HEP due to pt difficulty with one exercise, and adding in shoulder abd exercise to work on increasing ROM of R shoulder joint. Pt continues to exhibit limited ER, IR, and abduction of R shoulder as compared to her L shoulder and has some pain with  end-range stretching. Pt continues to benefit from skilled therapy services to address ongoing pain and decreased strength and ROM leading to decreased function. Continue POC.     OBJECTIVE IMPAIRMENTS: decreased ROM, decreased strength, and pain.   ACTIVITY LIMITATIONS: sleeping, reach over head, and caring for others  PARTICIPATION LIMITATIONS: community activity and yard work  PERSONAL FACTORS: Age and Fitness are also affecting patient's functional outcome.   REHAB POTENTIAL: Good  CLINICAL DECISION MAKING: Stable/uncomplicated  EVALUATION COMPLEXITY: Low   GOALS: Goals reviewed with patient? Yes  SHORT TERM GOALS= LONG TERM GOALS DUE TO POC LENGTH: Target date: 05/02/2022   Pt will be independent with final HEP for improved strength, balance, transfers and gait.  Baseline: not established on eval  Goal status: INITIAL  2.  Pt will improve NDI to </=5/50 for reduced pain levels and perceived functional ability  Baseline: 12/50 Goal status: INITIAL  3.  Pt will improve left lateral cervical flexion by 10 degrees for improved ROM and functional use of paraspinals   Baseline: 15 degrees  Goal status: INITIAL   PLAN:  PT FREQUENCY: 2x/week  PT DURATION: 4 weeks  PLANNED INTERVENTIONS: Therapeutic exercises, Therapeutic activity, Neuromuscular re-education, Balance training, Gait training, Patient/Family education, Self Care, Joint mobilization, Joint manipulation, Aquatic Therapy, Dry Needling, Cryotherapy, Moist heat, Manual therapy, and Re-evaluation  PLAN FOR NEXT SESSION: dry needling to paraspinals/upper traps/suboccipitals. Add to HEP for periscapular strength and cervical ROM, scap squeezes, assess R shoulder impingement, quadruped for shoulder/scapular strengthening and stability    Excell Seltzer, PT, DPT, CSRS 04/19/2022, 9:27 AM

## 2022-04-20 ENCOUNTER — Ambulatory Visit: Payer: Medicare HMO | Admitting: Physical Therapy

## 2022-04-20 DIAGNOSIS — M5481 Occipital neuralgia: Secondary | ICD-10-CM

## 2022-04-20 DIAGNOSIS — G8929 Other chronic pain: Secondary | ICD-10-CM | POA: Diagnosis not present

## 2022-04-20 DIAGNOSIS — M25511 Pain in right shoulder: Secondary | ICD-10-CM | POA: Diagnosis not present

## 2022-04-20 DIAGNOSIS — M6281 Muscle weakness (generalized): Secondary | ICD-10-CM | POA: Diagnosis not present

## 2022-04-20 NOTE — Therapy (Signed)
OUTPATIENT PHYSICAL THERAPY NEURO TREATMENT    Patient Name: Julia Singleton MRN: [id number] DOB:[birth year], 76 y.o., female Today's Date: 04/20/2022   PT End of Session - 04/20/22 0806     Visit Number 6    Number of Visits 9   Plus eval   Date for PT Re-Evaluation 05/09/22    Authorization Type [employer]    PT Start Time 0804    PT Stop Time 0845    PT Time Calculation (min) 41 min    Activity Tolerance Patient tolerated treatment well    Behavior During Therapy [facility] for tasks assessed/performed                  Past Medical History:  Diagnosis Date   Diverticulitis    Headache    IBS (irritable bowel syndrome)    Osteoporosis    Past Surgical History:  Procedure Laterality Date   BREAST BIOPSY Right    benign   TONSILLECTOMY AND ADENOIDECTOMY     Patient Active Problem List   Diagnosis Date Noted   Bilateral occipital neuralgia 02/20/2022   Palpitations 02/15/2022   Essential hypertension 02/15/2022    PCP: Matthew Folks, MD  REFERRING PROVIDER: Melvenia Beam, MD   REFERRING DIAG: [phone] (ICD-10-CM) - Bilateral occipital neuralgia;  M62.81 (ICD-10-CM) - Muscle weakness (generalized); M25.819 (ICD-10-CM) - Shoulder impingement  THERAPY DIAG:  Bilateral occipital neuralgia  Muscle weakness (generalized)  Chronic right shoulder pain  Rationale for Evaluation and Treatment: Rehabilitation  ONSET DATE: July 31st   SUBJECTIVE:                                                                                                                                                                                                         SUBJECTIVE STATEMENT: Pt reports she feels that overall her pain has been better. R shoulder is feeling much better, able to do her thread the needles now without much pain. Neck is a little flared up from dry needling yesterday, but nothing unexpected.   PERTINENT HISTORY:  HTN, osteoporosis, headache   PAIN:  Are you  having pain? Yes: NPRS scale: 3/10 Pain location: Occipital headache and R shoulder  Pain description: achy Aggravating factors: reports no consistency Relieving factors: Nothing  PRECAUTIONS: Fall  WEIGHT BEARING RESTRICTIONS: No  FALLS:  Has patient fallen in last 6 months? No  LIVING ENVIRONMENT: Lives with: lives with their spouse Lives in: House/apartment Stairs: Yes: External: 2 steps; none Has following equipment at home: None  OCCUPATION: Housewife   PLOF: Independent  PATIENT  GOALS: "to get rid of this headache"   OBJECTIVE:   DIAGNOSTIC FINDINGS:  MRI brain 01/26/2022: 1. No acute intracranial abnormality. No explanation for acute headache. 2. Mild for age cerebral white matter signal changes, nonspecific but most commonly due to chronic small vessel disease. 3. Mild chronic paranasal sinus inflammation, significance doubtful.  PATIENT SURVEYS:  NDI 12/50 (24%- mild disability)  COGNITION: Overall cognitive status: Within functional limits for tasks assessed  SENSATION: WFL  POSTURE: rounded shoulders and increased thoracic kyphosis  PALPATION: Very tender/tight on bilateral upper traps     TODAY'S TREATMENT:    Ther Ex SciFit multi-peaks level 6 for 8 minutes using BUE/BLEs for dynamic cardiovascular warmup, UE/LE coordination and global strengthening.  In prone for improved shoulder mobility, posterior chain strength and UE strength:  W/green noodle under R shoulder in 90 degrees of abduction and elbow flexion, ER lift offs, x15  Supermans x15  I, Y, T's 2x10 each with short rest break in between sets. Min cues for activation of scapular retractors. Added to HEP (see bolded below)                                                                    PATIENT EDUCATION:  Education details: Updates to HEP Person educated: Patient Education method: Explanation, Demonstration, Tactile cues, Verbal cues, and Handouts Education comprehension: verbalized  understanding and needs further education  HOME EXERCISE PROGRAM: Access Code: [email or web address] URL: [email or web address] Date: 04/07/2022 Prepared by: Excell Seltzer  Exercises - Seated Upper Trapezius Stretch  - 1 x daily - 7 x weekly - 1 sets - 5 reps - 30-60 sec hold - Seated Cervical Flexion AROM  - 1 x daily - 7 x weekly - 1 sets - 5 reps - 30-60 sec hold - Chin Tuck  - 1 x daily - 7 x weekly - 3 sets - 10 reps - 5 second hold - Seated Shoulder Row with Anchored Resistance  - 1 x daily - 7 x weekly - 3 sets - 15-20 reps - 2-3 second  hold - Plank with Thoracic Rotation on Counter  - 1 x daily - 7 x weekly - 3 sets - 10 reps - Plank on Table with Scapular Protraction Retraction  - 1 x daily - 7 x weekly - 3 sets - 10 reps - 2-3 second hold - Shoulder Internal and External Rotation in Abduction  - 1 x daily - 7 x weekly - 3 sets - 10 reps - Seated Shoulder Abduction Towel Slide at Table Top  - 1 x daily - 7 x weekly - 3 sets - 10 reps - 5 hold - Prone Shoulder Flexion  - 1 x daily - 7 x weekly - 3 sets - 10 reps - Prone Single Arm Shoulder Y  - 1 x daily - 7 x weekly - 3 sets - 10 reps - Prone Shoulder Horizontal Abduction with Thumbs Up  - 1 x daily - 7 x weekly - 3 sets - 10 reps   ASSESSMENT:  CLINICAL IMPRESSION: Emphasis of skilled PT session on endurance, improved shoulder ROM and activation of periscapular muscles.  Pt reported mild tenderness in her neck following dry needling yesterday that dissipated by end of session. Pt continues to require min  cues to reduce compensation strategies for shoulder elevation and cervical ROM but demonstrates improved BUE strength and body mechanics. Continue POC.    OBJECTIVE IMPAIRMENTS: decreased ROM, decreased strength, and pain.   ACTIVITY LIMITATIONS: sleeping, reach over head, and caring for others  PARTICIPATION LIMITATIONS: community activity and yard work  PERSONAL FACTORS: Age and Fitness are also affecting patient's  functional outcome.   REHAB POTENTIAL: Good  CLINICAL DECISION MAKING: Stable/uncomplicated  EVALUATION COMPLEXITY: Low   GOALS: Goals reviewed with patient? Yes  SHORT TERM GOALS= LONG TERM GOALS DUE TO POC LENGTH: Target date: 05/02/2022   Pt will be independent with final HEP for improved strength, balance, transfers and gait.  Baseline: not established on eval  Goal status: INITIAL  2.  Pt will improve NDI to </=5/50 for reduced pain levels and perceived functional ability  Baseline: 12/50 Goal status: INITIAL  3.  Pt will improve left lateral cervical flexion by 10 degrees for improved ROM and functional use of paraspinals   Baseline: 15 degrees  Goal status: INITIAL   PLAN:  PT FREQUENCY: 2x/week  PT DURATION: 4 weeks  PLANNED INTERVENTIONS: Therapeutic exercises, Therapeutic activity, Neuromuscular re-education, Balance training, Gait training, Patient/Family education, Self Care, Joint mobilization, Joint manipulation, Aquatic Therapy, Dry Needling, Cryotherapy, Moist heat, Manual therapy, and Re-evaluation  PLAN FOR NEXT SESSION: dry needling to paraspinals/upper traps/suboccipitals. Add to HEP for periscapular strength and cervical ROM, scap squeezes, assess R shoulder impingement, quadruped for shoulder/scapular strengthening and stability    Amonie Wisser E Timarion Agcaoili, PT, DPT  04/20/2022, 8:45 AM

## 2022-04-25 DIAGNOSIS — R110 Nausea: Secondary | ICD-10-CM | POA: Diagnosis not present

## 2022-04-25 DIAGNOSIS — R7309 Other abnormal glucose: Secondary | ICD-10-CM | POA: Diagnosis not present

## 2022-04-25 DIAGNOSIS — R140 Abdominal distension (gaseous): Secondary | ICD-10-CM | POA: Diagnosis not present

## 2022-05-01 ENCOUNTER — Ambulatory Visit: Payer: Medicare HMO | Admitting: Physical Therapy

## 2022-05-01 DIAGNOSIS — G8929 Other chronic pain: Secondary | ICD-10-CM | POA: Diagnosis not present

## 2022-05-01 DIAGNOSIS — M5481 Occipital neuralgia: Secondary | ICD-10-CM

## 2022-05-01 DIAGNOSIS — M6281 Muscle weakness (generalized): Secondary | ICD-10-CM

## 2022-05-01 DIAGNOSIS — M25511 Pain in right shoulder: Secondary | ICD-10-CM | POA: Diagnosis not present

## 2022-05-01 NOTE — Therapy (Signed)
OUTPATIENT PHYSICAL THERAPY NEURO TREATMENT    Patient Name: Julia Singleton MRN: [id number] DOB:Dec 29, 1945, 76 y.o., female Today's Date: 05/01/2022   PT End of Session - 05/01/22 0802     Visit Number 7    Number of Visits 9   Plus eval   Date for PT Re-Evaluation 05/09/22    Authorization Type [employer]    PT Start Time 0801    PT Stop Time 0844    PT Time Calculation (min) 43 min    Activity Tolerance Patient tolerated treatment well    Behavior During Therapy [facility] for tasks assessed/performed                   Past Medical History:  Diagnosis Date   Diverticulitis    Headache    IBS (irritable bowel syndrome)    Osteoporosis    Past Surgical History:  Procedure Laterality Date   BREAST BIOPSY Right    benign   TONSILLECTOMY AND ADENOIDECTOMY     Patient Active Problem List   Diagnosis Date Noted   Bilateral occipital neuralgia 02/20/2022   Palpitations 02/15/2022   Essential hypertension 02/15/2022    PCP: Matthew Folks, MD  REFERRING PROVIDER: Melvenia Beam, MD   REFERRING DIAG: [phone] (ICD-10-CM) - Bilateral occipital neuralgia;  M62.81 (ICD-10-CM) - Muscle weakness (generalized); M25.819 (ICD-10-CM) - Shoulder impingement  THERAPY DIAG:  Bilateral occipital neuralgia  Muscle weakness (generalized)  Chronic right shoulder pain  Rationale for Evaluation and Treatment: Rehabilitation  ONSET DATE: July 31st   SUBJECTIVE:                                                                                                                                                                                                         SUBJECTIVE STATEMENT: Pt reports she is disappointed, had quite a few days with 0/10 pain and then Friday she "did something weird". Having 3/10 pain in her head today and her anterior R shoulder is bothering her. "My shoulder is okay until I move it, and then it shoots up to a 7/10".   PERTINENT HISTORY:  HTN,  osteoporosis, headache   PAIN:  Are you having pain? Yes: NPRS scale: 3/10 Pain location: Occipital headache and R shoulder  Pain description: achy Aggravating factors: reports no consistency Relieving factors: Nothing  PRECAUTIONS: Fall  WEIGHT BEARING RESTRICTIONS: No  FALLS:  Has patient fallen in last 6 months? No  LIVING ENVIRONMENT: Lives with: lives with their spouse Lives in: House/apartment Stairs: Yes: External: 2 steps; none Has following equipment at  home: None  OCCUPATION: Housewife   PLOF: Independent  PATIENT GOALS: "to get rid of this headache"   OBJECTIVE:   DIAGNOSTIC FINDINGS:  MRI brain 01/26/2022: 1. No acute intracranial abnormality. No explanation for acute headache. 2. Mild for age cerebral white matter signal changes, nonspecific but most commonly due to chronic small vessel disease. 3. Mild chronic paranasal sinus inflammation, significance doubtful.  PATIENT SURVEYS:  NDI 12/50 (24%- mild disability)  COGNITION: Overall cognitive status: Within functional limits for tasks assessed  SENSATION: WFL  POSTURE: rounded shoulders and increased thoracic kyphosis  PALPATION: Very tender/tight on bilateral upper traps     TODAY'S TREATMENT:    Ther Ex SciFit multi-peaks level 6 for 8 minutes using BUE/BLEs for dynamic cardiovascular warmup, UE/LE coordination and global strengthening.  Added to HEP for improved shoulder ROM and upper trap strength: Supine Shoulder External Rotation with Dowel  - x12 reps on R shoulder only. Min cues to maintain adduction of RLE throughout  Sleeper Stretch on R shoulder only, x10 reps  Standing Shoulder Shrugs with 5# Dumbbells, x10 reps  Ther Act Yergason's Test- negative  Left cervical flexion tested in seated position - 19 degrees                                                                    PATIENT EDUCATION:  Education details: Updates to HEP Person educated: Patient Education method:  Consulting civil engineer, Demonstration, Tactile cues, Verbal cues, and Handouts Education comprehension: verbalized understanding and needs further education  HOME EXERCISE PROGRAM: Access Code: [email or web address] URL: [email or web address] Date: 05/01/2022 Prepared by: Mickie Bail Amore Ackman  Exercises - Seated Upper Trapezius Stretch  - 1 x daily - 7 x weekly - 1 sets - 5 reps - 30-60 sec hold - Chin Tuck  - 1 x daily - 7 x weekly - 3 sets - 10 reps - 5 second hold - Seated Shoulder Row with Anchored Resistance  - 1 x daily - 7 x weekly - 3 sets - 15-20 reps - 2-3 second  hold - Plank with Thoracic Rotation on Counter  - 1 x daily - 7 x weekly - 3 sets - 10 reps - Plank on Table with Scapular Protraction Retraction  - 1 x daily - 7 x weekly - 3 sets - 10 reps - 2-3 second hold - Prone Shoulder I  - 1 x daily - 7 x weekly - 2 sets - 10 reps - Prone shoulder Y   - 1 x daily - 7 x weekly - 2 sets - 10 reps - Prone shoulder "T"  - 1 x daily - 7 x weekly - 2 sets - 10 reps - Supine Shoulder External Rotation with Dowel  - 1 x daily - 7 x weekly - 3 sets - 10 reps - Sleeper Stretch  - 1 x daily - 7 x weekly - 3 sets - 10 reps - Standing Shoulder Shrugs with Dumbbells  - 1 x daily - 7 x weekly - 3 sets - 10 reps   ASSESSMENT:  CLINICAL IMPRESSION: Emphasis of skilled PT session on LTG assessment and updating HEP for improved R shoulder ROM and pain modulation. Pt has minimally improved her L lateral cervical flexion by 4 degrees, not to goal level. Pt  continues to be limited by R shoulder pain, likely due to impingement not related to bicep origin. Pt continues to benefit from PT to address R shoulder impingement and reduced cervical ROM. Continue POC.    OBJECTIVE IMPAIRMENTS: decreased ROM, decreased strength, and pain.   ACTIVITY LIMITATIONS: sleeping, reach over head, and caring for others  PARTICIPATION LIMITATIONS: community activity and yard work  PERSONAL FACTORS: Age and Fitness are also  affecting patient's functional outcome.   REHAB POTENTIAL: Good  CLINICAL DECISION MAKING: Stable/uncomplicated  EVALUATION COMPLEXITY: Low   GOALS: Goals reviewed with patient? Yes  SHORT TERM GOALS= LONG TERM GOALS DUE TO POC LENGTH: Target date: 05/08/2022 (goal date updated to match POC)   Pt will be independent with final HEP for improved strength, balance, transfers and gait.  Baseline: not established on eval  Goal status: INITIAL  2.  Pt will improve NDI to </=5/50 for reduced pain levels and perceived functional ability  Baseline: 12/50 Goal status: INITIAL  3.  Pt will improve left lateral cervical flexion by 10 degrees for improved ROM and functional use of paraspinals   Baseline: 15 degrees; 19 degrees on 11/27  Goal status: IN PROGRESS   PLAN:  PT FREQUENCY: 2x/week  PT DURATION: 4 weeks  PLANNED INTERVENTIONS: Therapeutic exercises, Therapeutic activity, Neuromuscular re-education, Balance training, Gait training, Patient/Family education, Self Care, Joint mobilization, Joint manipulation, Aquatic Therapy, Dry Needling, Cryotherapy, Moist heat, Manual therapy, and Re-evaluation  PLAN FOR NEXT SESSION: dry needling to paraspinals/upper traps/suboccipitals. Add to HEP for periscapular strength and cervical ROM, scap squeezes, assess R shoulder impingement, quadruped for shoulder/scapular strengthening and stability    Tedd Cottrill E Ryka Beighley, PT, DPT  05/01/2022, 8:45 AM

## 2022-05-03 ENCOUNTER — Ambulatory Visit: Payer: Medicare HMO | Admitting: Physical Therapy

## 2022-05-03 DIAGNOSIS — M6281 Muscle weakness (generalized): Secondary | ICD-10-CM | POA: Diagnosis not present

## 2022-05-03 DIAGNOSIS — M5481 Occipital neuralgia: Secondary | ICD-10-CM | POA: Diagnosis not present

## 2022-05-03 DIAGNOSIS — G8929 Other chronic pain: Secondary | ICD-10-CM | POA: Diagnosis not present

## 2022-05-03 DIAGNOSIS — M25511 Pain in right shoulder: Secondary | ICD-10-CM | POA: Diagnosis not present

## 2022-05-03 NOTE — Therapy (Signed)
OUTPATIENT PHYSICAL THERAPY NEURO TREATMENT    Patient Name: Julia Singleton MRN: [id number] DOB:19-Aug-1945, 76 y.o., female Today's Date: 05/03/2022   PT End of Session - 05/03/22 0847     Visit Number 8    Number of Visits 9   Plus eval   Date for PT Re-Evaluation 05/09/22    Authorization Type [employer]    PT Start Time 0845    PT Stop Time 0925    PT Time Calculation (min) 40 min    Activity Tolerance Patient tolerated treatment well    Behavior During Therapy [facility] for tasks assessed/performed                    Past Medical History:  Diagnosis Date   Diverticulitis    Headache    IBS (irritable bowel syndrome)    Osteoporosis    Past Surgical History:  Procedure Laterality Date   BREAST BIOPSY Right    benign   TONSILLECTOMY AND ADENOIDECTOMY     Patient Active Problem List   Diagnosis Date Noted   Bilateral occipital neuralgia 02/20/2022   Palpitations 02/15/2022   Essential hypertension 02/15/2022    PCP: Matthew Folks, MD  REFERRING PROVIDER: Melvenia Beam, MD   REFERRING DIAG: [phone] (ICD-10-CM) - Bilateral occipital neuralgia;  M62.81 (ICD-10-CM) - Muscle weakness (generalized); M25.819 (ICD-10-CM) - Shoulder impingement  THERAPY DIAG:  Bilateral occipital neuralgia  Chronic right shoulder pain  Muscle weakness (generalized)  Rationale for Evaluation and Treatment: Rehabilitation  ONSET DATE: July 31st   SUBJECTIVE:                                                                                                                                                                                                         SUBJECTIVE STATEMENT: Pt reports her pain got flared up over Thanksgiving weekend in her R shoulder/bicep region but has improved over the past few days. Pt also reports feeling sore after doing exercises last session. Pt reports 2/10 pain in neck, headache and 3/10 in shoulders.   PERTINENT HISTORY:  HTN,  osteoporosis, headache   PAIN:  Are you having pain? Yes: NPRS scale: 3/10 Pain location: Occipital headache and R shoulder  Pain description: achy Aggravating factors: reports no consistency Relieving factors: Nothing  PRECAUTIONS: Fall  WEIGHT BEARING RESTRICTIONS: No  FALLS:  Has patient fallen in last 6 months? No  LIVING ENVIRONMENT: Lives with: lives with their spouse Lives in: House/apartment Stairs: Yes: External: 2 steps; none Has following equipment at home: None  OCCUPATION: Housewife  PLOF: Independent  PATIENT GOALS: "to get rid of this headache"   OBJECTIVE:   DIAGNOSTIC FINDINGS:  MRI brain 01/26/2022: 1. No acute intracranial abnormality. No explanation for acute headache. 2. Mild for age cerebral white matter signal changes, nonspecific but most commonly due to chronic small vessel disease. 3. Mild chronic paranasal sinus inflammation, significance doubtful.  PATIENT SURVEYS:  NDI 12/50 (24%- mild disability)  COGNITION: Overall cognitive status: Within functional limits for tasks assessed  SENSATION: WFL  POSTURE: rounded shoulders and increased thoracic kyphosis  PALPATION: Very tender/tight on bilateral upper traps     TODAY'S TREATMENT:    Ther Ex SciFit multi-peaks level 7 for 8 minutes using BUE/BLEs for dynamic cardiovascular warmup, UE/LE coordination and global strengthening.  In quadruped on mat table Alt L/R UE reach with thoracic stretch Alt L/R thread the needle Added to HEP, see bolded below  Ther Act  Trigger Point Dry-Needling  Treatment instructions: Expect mild to moderate muscle soreness. S/S of pneumothorax if dry needled over a lung field, and to seek immediate medical attention should they occur. Patient verbalized understanding of these instructions and education.  Patient Consent Given: Yes Education handout provided: Previously provided Muscles treated: R suboccipitals, R UT, R biceps Treatment  response/outcome: deep muscle ache/cramping, muscle twitch detected                                                                    PATIENT EDUCATION:  Education details: continue HEP, PT POC Person educated: Patient Education method: Explanation, Demonstration, Tactile cues, Verbal cues, and Handouts Education comprehension: verbalized understanding and needs further education  HOME EXERCISE PROGRAM: Access Code: [email or web address] URL: [email or web address] Date: 05/01/2022 Prepared by: Mickie Bail Plaster  Exercises - Seated Upper Trapezius Stretch  - 1 x daily - 7 x weekly - 1 sets - 5 reps - 30-60 sec hold - Chin Tuck  - 1 x daily - 7 x weekly - 3 sets - 10 reps - 5 second hold - Seated Shoulder Row with Anchored Resistance  - 1 x daily - 7 x weekly - 3 sets - 15-20 reps - 2-3 second  hold - Plank with Thoracic Rotation on Counter  - 1 x daily - 7 x weekly - 3 sets - 10 reps - Plank on Table with Scapular Protraction Retraction  - 1 x daily - 7 x weekly - 3 sets - 10 reps - 2-3 second hold - Prone Shoulder I  - 1 x daily - 7 x weekly - 2 sets - 10 reps - Prone shoulder Y   - 1 x daily - 7 x weekly - 2 sets - 10 reps - Prone shoulder "T"  - 1 x daily - 7 x weekly - 2 sets - 10 reps - Supine Shoulder External Rotation with Dowel  - 1 x daily - 7 x weekly - 3 sets - 10 reps - Sleeper Stretch  - 1 x daily - 7 x weekly - 3 sets - 10 reps - Standing Shoulder Shrugs with Dumbbells  - 1 x daily - 7 x weekly - 3 sets - 10 reps - Quadruped Full Range Thoracic Rotation with Reach  - 1 x daily - 7 x weekly - 3 sets - 10 reps -  Quadruped Thoracic Rotation - Reach Under  - 1 x daily - 7 x weekly - 3 sets - 10 reps   ASSESSMENT:  CLINICAL IMPRESSION: Emphasis of skilled PT session on performing TPDN to address ongoing pain and tightness in neck, shoulder, and RUE followed by exercise on SciFit for ROM and strengthening following DN treatment. Pt exhibits observable muscle twitch with DN  this date with some expected soreness following treatment. Pt is comfortable with her current HEP and agreeable to plan to d/c from PT after next session. Continue POC.    OBJECTIVE IMPAIRMENTS: decreased ROM, decreased strength, and pain.   ACTIVITY LIMITATIONS: sleeping, reach over head, and caring for others  PARTICIPATION LIMITATIONS: community activity and yard work  PERSONAL FACTORS: Age and Fitness are also affecting patient's functional outcome.   REHAB POTENTIAL: Good  CLINICAL DECISION MAKING: Stable/uncomplicated  EVALUATION COMPLEXITY: Low   GOALS: Goals reviewed with patient? Yes  SHORT TERM GOALS= LONG TERM GOALS DUE TO POC LENGTH: Target date: 05/08/2022 (goal date updated to match POC)   Pt will be independent with final HEP for improved strength, balance, transfers and gait. Baseline: not established on eval  Goal status: INITIAL  2.  Pt will improve NDI to </=5/50 for reduced pain levels and perceived functional ability  Baseline: 12/50 Goal status: INITIAL  3.  Pt will improve left lateral cervical flexion by 10 degrees for improved ROM and functional use of paraspinals   Baseline: 15 degrees; 19 degrees on 11/27  Goal status: IN PROGRESS   PLAN:  PT FREQUENCY: 2x/week  PT DURATION: 4 weeks  PLANNED INTERVENTIONS: Therapeutic exercises, Therapeutic activity, Neuromuscular re-education, Balance training, Gait training, Patient/Family education, Self Care, Joint mobilization, Joint manipulation, Aquatic Therapy, Dry Needling, Cryotherapy, Moist heat, Manual therapy, and Re-evaluation  PLAN FOR NEXT SESSION: assess goals and d/c from PT!   Excell Seltzer, PT, DPT, CSRS  05/03/2022, 9:26 AM

## 2022-05-08 ENCOUNTER — Ambulatory Visit: Payer: Medicare HMO | Attending: Neurology | Admitting: Physical Therapy

## 2022-05-08 DIAGNOSIS — G8929 Other chronic pain: Secondary | ICD-10-CM | POA: Diagnosis not present

## 2022-05-08 DIAGNOSIS — M25511 Pain in right shoulder: Secondary | ICD-10-CM | POA: Diagnosis not present

## 2022-05-08 DIAGNOSIS — M6281 Muscle weakness (generalized): Secondary | ICD-10-CM | POA: Insufficient documentation

## 2022-05-08 DIAGNOSIS — M5481 Occipital neuralgia: Secondary | ICD-10-CM | POA: Insufficient documentation

## 2022-05-08 NOTE — Therapy (Signed)
OUTPATIENT PHYSICAL THERAPY NEURO TREATMENT -DISCHARGE NOTE   Patient Name: Julia Singleton MRN: [id number] DOB:05-Sep-1945, 75 y.o., female Today's Date: 05/08/2022   PT End of Session - 05/08/22 1154     Visit Number 9    Number of Visits 9   Plus eval   Date for PT Re-Evaluation 05/09/22    Authorization Type [employer]    PT Start Time 1153    PT Stop Time 9983   d/c   PT Time Calculation (min) 31 min    Activity Tolerance Patient tolerated treatment well    Behavior During Therapy WFL for tasks assessed/performed                   PHYSICAL THERAPY DISCHARGE SUMMARY  Visits from Start of Care: 9  Current functional level related to goals / functional outcomes: Independent    Remaining deficits: Ongoing neck pain and headaches   Education / Equipment: Handouts for HEP   Patient agrees to discharge. Patient goals were partially met. Patient is being discharged due to being pleased with the current functional level.   Past Medical History:  Diagnosis Date   Diverticulitis    Headache    IBS (irritable bowel syndrome)    Osteoporosis    Past Surgical History:  Procedure Laterality Date   BREAST BIOPSY Right    benign   TONSILLECTOMY AND ADENOIDECTOMY     Patient Active Problem List   Diagnosis Date Noted   Bilateral occipital neuralgia 02/20/2022   Palpitations 02/15/2022   Essential hypertension 02/15/2022    PCP: Matthew Folks, MD  REFERRING PROVIDER: Melvenia Beam, MD   REFERRING DIAG: [phone] (ICD-10-CM) - Bilateral occipital neuralgia;  M62.81 (ICD-10-CM) - Muscle weakness (generalized); M25.819 (ICD-10-CM) - Shoulder impingement  THERAPY DIAG:  Bilateral occipital neuralgia  Muscle weakness (generalized)  Chronic right shoulder pain  Rationale for Evaluation and Treatment: Rehabilitation  ONSET DATE: July 31st   SUBJECTIVE:                                                                                                                                                                                                          SUBJECTIVE STATEMENT: Pt reports she had a rough day yesterday due to sinuses being irritated which lead to a headache as well as some gut disturbances. Pt reports she is doing better today but she still has tightness in her shoulders and is still feeling sinus pressure today leading to a headache. Pt brings in her pillow and HEP to review sleeping positions and  HEP schedule.   PERTINENT HISTORY:  HTN, osteoporosis, headache   PAIN:  Are you having pain? Yes: NPRS scale: 3/10 Pain location: Occipital headache and R shoulder  Pain description: achy Aggravating factors: reports no consistency Relieving factors: Nothing  PRECAUTIONS: Fall  WEIGHT BEARING RESTRICTIONS: No  FALLS:  Has patient fallen in last 6 months? No  LIVING ENVIRONMENT: Lives with: lives with their spouse Lives in: House/apartment Stairs: Yes: External: 2 steps; none Has following equipment at home: None  OCCUPATION: Housewife   PLOF: Independent  PATIENT GOALS: "to get rid of this headache"   OBJECTIVE:   DIAGNOSTIC FINDINGS:  MRI brain 01/26/2022: 1. No acute intracranial abnormality. No explanation for acute headache. 2. Mild for age cerebral white matter signal changes, nonspecific but most commonly due to chronic small vessel disease. 3. Mild chronic paranasal sinus inflammation, significance doubtful.  PATIENT SURVEYS:  NDI 15/50 (30%- mild disability)  COGNITION: Overall cognitive status: Within functional limits for tasks assessed  SENSATION: WFL  POSTURE: rounded shoulders and increased thoracic kyphosis  PALPATION: Very tender/tight on bilateral upper traps     TODAY'S TREATMENT:    TherAct   Reassessed L lateral cervical flexion: 32 degrees  Discussed schedule of HEP performance as pt reports her entire HEP takes her about an hour each day in addition to her walking for one hour per  day and wanting to restart Silver Sneakers. Divided her HEP into stretches and into strengthening exercises. Encouraged pt to perform 2-3 stretches and 2-3 exercises from HEP each day rather than try to perform entire HEP each day.   Trigger Point Dry-Needling  Treatment instructions: Expect mild to moderate muscle soreness. S/S of pneumothorax if dry needled over a lung field, and to seek immediate medical attention should they occur. Patient verbalized understanding of these instructions and education.  Patient Consent Given: Yes Education handout provided: Previously provided Muscles treated: R and L suboccipitals Treatment response/outcome: deep muscle ache/cramping, muscle twitch detected                                                                    PATIENT EDUCATION:  Education details: continue HEP, TPDN, d/c from PT Person educated: Patient Education method: Explanation, Demonstration, Tactile cues, Verbal cues, and Handouts Education comprehension: verbalized understanding  HOME EXERCISE PROGRAM: Access Code: [email or web address] URL: [email or web address] Date: 05/01/2022 Prepared by: Mickie Bail Plaster  Exercises - Seated Upper Trapezius Stretch  - 1 x daily - 7 x weekly - 1 sets - 5 reps - 30-60 sec hold - Chin Tuck  - 1 x daily - 7 x weekly - 3 sets - 10 reps - 5 second hold - Seated Shoulder Row with Anchored Resistance  - 1 x daily - 7 x weekly - 3 sets - 15-20 reps - 2-3 second  hold - Plank with Thoracic Rotation on Counter  - 1 x daily - 7 x weekly - 3 sets - 10 reps - Plank on Table with Scapular Protraction Retraction  - 1 x daily - 7 x weekly - 3 sets - 10 reps - 2-3 second hold - Prone Shoulder I  - 1 x daily - 7 x weekly - 2 sets - 10 reps - Prone shoulder Y   - 1 x  daily - 7 x weekly - 2 sets - 10 reps - Prone shoulder "T"  - 1 x daily - 7 x weekly - 2 sets - 10 reps - Supine Shoulder External Rotation with Dowel  - 1 x daily - 7 x weekly - 3 sets - 10  reps - Sleeper Stretch  - 1 x daily - 7 x weekly - 3 sets - 10 reps - Standing Shoulder Shrugs with Dumbbells  - 1 x daily - 7 x weekly - 3 sets - 10 reps - Quadruped Full Range Thoracic Rotation with Reach  - 1 x daily - 7 x weekly - 3 sets - 10 reps - Quadruped Thoracic Rotation - Reach Under  - 1 x daily - 7 x weekly - 3 sets - 10 reps   ASSESSMENT:  CLINICAL IMPRESSION: Emphasis of skilled PT session on performing TPDN to address ongoing pain and tightness in neck and shoulder region as well as reassessing LTG in preparation for d/c from PT this date. Pt has met 2/3 LTG due to being independent with her HEP and improving her L cervical lateral flexion from 15 degrees initially to 32 degrees this date. Pt exhibits increased disability level based on her increase in NDI score from 12/50 initially to 15/50 this date. Pt exhibits observable muscle twitch with DN this date with some expected soreness following treatment. Pt is comfortable with her current HEP and agreeable to d/c from PT services this date.   OBJECTIVE IMPAIRMENTS: decreased ROM, decreased strength, and pain.   ACTIVITY LIMITATIONS: sleeping, reach over head, and caring for others  PARTICIPATION LIMITATIONS: community activity and yard work  PERSONAL FACTORS: Age and Fitness are also affecting patient's functional outcome.   REHAB POTENTIAL: Good  CLINICAL DECISION MAKING: Stable/uncomplicated  EVALUATION COMPLEXITY: Low   GOALS: Goals reviewed with patient? Yes  SHORT TERM GOALS= LONG TERM GOALS DUE TO POC LENGTH: Target date: 05/08/2022 (goal date updated to match POC)   Pt will be independent with final HEP for improved strength, balance, transfers and gait. Baseline: not established on eval  Goal status: MET  2.  Pt will improve NDI to </=5/50 for reduced pain levels and perceived functional ability  Baseline: 12/50, 15/50 (12/4) Goal status: NOT MET  3.  Pt will improve left lateral cervical flexion by 10  degrees for improved ROM and functional use of paraspinals   Baseline: 15 degrees; 19 degrees on 11/27, 32 degrees on 12/4  Goal status: MET    Excell Seltzer, PT, DPT, CSRS  05/08/2022, 12:27 PM

## 2022-05-10 ENCOUNTER — Ambulatory Visit: Payer: Medicare HMO | Admitting: Physical Therapy

## 2022-05-19 DIAGNOSIS — H1045 Other chronic allergic conjunctivitis: Secondary | ICD-10-CM | POA: Diagnosis not present

## 2022-06-19 DIAGNOSIS — M810 Age-related osteoporosis without current pathological fracture: Secondary | ICD-10-CM | POA: Diagnosis not present

## 2022-06-20 ENCOUNTER — Encounter: Payer: Self-pay | Admitting: Neurology

## 2022-06-27 NOTE — Addendum Note (Signed)
Addended by: Sarina Ill B on: 06/27/2022 06:00 PM   Modules accepted: Orders

## 2022-06-28 ENCOUNTER — Telehealth: Payer: Self-pay | Admitting: Neurology

## 2022-06-28 NOTE — Telephone Encounter (Signed)
Craig Staggers: [phone] exp. 06/28/22-07/28/22 sent to GI [phone]

## 2022-07-11 ENCOUNTER — Ambulatory Visit
Admission: RE | Admit: 2022-07-11 | Discharge: 2022-07-11 | Disposition: A | Discharge status: Home / Self Care | Payer: Medicare HMO | Source: Ambulatory Visit | Attending: Neurology | Admitting: Neurology

## 2022-07-11 DIAGNOSIS — M542 Cervicalgia: Secondary | ICD-10-CM

## 2022-07-11 DIAGNOSIS — M5481 Occipital neuralgia: Secondary | ICD-10-CM

## 2022-07-11 DIAGNOSIS — M8580 Other specified disorders of bone density and structure, unspecified site: Secondary | ICD-10-CM

## 2022-07-11 DIAGNOSIS — M47812 Spondylosis without myelopathy or radiculopathy, cervical region: Secondary | ICD-10-CM | POA: Diagnosis not present

## 2022-07-11 DIAGNOSIS — M4802 Spinal stenosis, cervical region: Secondary | ICD-10-CM | POA: Diagnosis not present

## 2022-07-11 DIAGNOSIS — M5412 Radiculopathy, cervical region: Secondary | ICD-10-CM

## 2022-07-11 DIAGNOSIS — M4712 Other spondylosis with myelopathy, cervical region: Secondary | ICD-10-CM

## 2022-07-18 ENCOUNTER — Telehealth: Payer: Self-pay | Admitting: Neurology

## 2022-07-18 ENCOUNTER — Telehealth: Payer: Self-pay | Admitting: *Deleted

## 2022-07-18 DIAGNOSIS — M5412 Radiculopathy, cervical region: Secondary | ICD-10-CM

## 2022-07-18 DIAGNOSIS — M4802 Spinal stenosis, cervical region: Secondary | ICD-10-CM

## 2022-07-18 NOTE — Telephone Encounter (Signed)
Referral sent to Dr. Reatha Armour @ [facility], phone # [phone].

## 2022-07-18 NOTE — Telephone Encounter (Signed)
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 This could be causing a lot of neck pain and at several levels the holes that the nerves go through could be causing some irritation. I would send her to Dr. Reatha Armour at [facility] fo rhis opinion on injections or possibly surgery if really needed. He can at least take a look and discuss options with patient thanks. (Cervical radiculopathy and cervical stenosis)

## 2022-07-18 NOTE — Telephone Encounter (Signed)
Spoke with patient and discussed results of MRI c-spine as noted below by Dr Jaynee Eagles. Patient verbalized understanding and is happy to proceed with referral to Dr Dawley to explore options for treatment such as injections and possibly surgery if needed. Patient will watch for a call from [facility] to schedule consultation. She was appreciative of the call and will f/u if she hasn't heard by next week.   Referral placed to Dr Dawley for Cervical radiculopathy and cervical stenosis per v.o. Dr Jaynee Eagles.

## 2022-08-02 DIAGNOSIS — M5481 Occipital neuralgia: Secondary | ICD-10-CM | POA: Diagnosis not present

## 2022-08-02 DIAGNOSIS — M542 Cervicalgia: Secondary | ICD-10-CM | POA: Diagnosis not present

## 2022-08-15 DIAGNOSIS — J351 Hypertrophy of tonsils: Secondary | ICD-10-CM | POA: Diagnosis not present

## 2022-08-15 DIAGNOSIS — H903 Sensorineural hearing loss, bilateral: Secondary | ICD-10-CM | POA: Diagnosis not present

## 2022-08-15 DIAGNOSIS — Z974 Presence of external hearing-aid: Secondary | ICD-10-CM | POA: Diagnosis not present

## 2022-08-22 DIAGNOSIS — R140 Abdominal distension (gaseous): Secondary | ICD-10-CM | POA: Diagnosis not present

## 2022-08-22 DIAGNOSIS — R110 Nausea: Secondary | ICD-10-CM | POA: Diagnosis not present

## 2022-09-13 ENCOUNTER — Encounter (HOSPITAL_BASED_OUTPATIENT_CLINIC_OR_DEPARTMENT_OTHER): Payer: Self-pay | Admitting: Otolaryngology

## 2022-09-15 ENCOUNTER — Other Ambulatory Visit: Payer: Self-pay | Admitting: Otolaryngology

## 2022-09-20 ENCOUNTER — Other Ambulatory Visit: Payer: Self-pay

## 2022-09-20 ENCOUNTER — Ambulatory Visit (HOSPITAL_BASED_OUTPATIENT_CLINIC_OR_DEPARTMENT_OTHER): Payer: Medicare HMO | Admitting: Anesthesiology

## 2022-09-20 ENCOUNTER — Encounter (HOSPITAL_BASED_OUTPATIENT_CLINIC_OR_DEPARTMENT_OTHER): Payer: Self-pay | Admitting: Otolaryngology

## 2022-09-20 ENCOUNTER — Ambulatory Visit (HOSPITAL_BASED_OUTPATIENT_CLINIC_OR_DEPARTMENT_OTHER)
Admission: RE | Admit: 2022-09-20 | Discharge: 2022-09-20 | Disposition: A | Discharge status: Home / Self Care | Payer: Medicare HMO | Attending: Otolaryngology | Admitting: Otolaryngology

## 2022-09-20 ENCOUNTER — Encounter (HOSPITAL_BASED_OUTPATIENT_CLINIC_OR_DEPARTMENT_OTHER)
Admission: RE | Disposition: A | Discharge status: Home / Self Care | Payer: Self-pay | Source: Home / Self Care | Attending: Otolaryngology

## 2022-09-20 DIAGNOSIS — K589 Irritable bowel syndrome without diarrhea: Secondary | ICD-10-CM | POA: Diagnosis not present

## 2022-09-20 DIAGNOSIS — J351 Hypertrophy of tonsils: Secondary | ICD-10-CM | POA: Diagnosis not present

## 2022-09-20 DIAGNOSIS — G709 Myoneural disorder, unspecified: Secondary | ICD-10-CM | POA: Insufficient documentation

## 2022-09-20 DIAGNOSIS — Z01818 Encounter for other preprocedural examination: Secondary | ICD-10-CM

## 2022-09-20 DIAGNOSIS — J353 Hypertrophy of tonsils with hypertrophy of adenoids: Secondary | ICD-10-CM | POA: Diagnosis not present

## 2022-09-20 HISTORY — PX: DIRECT LARYNGOSCOPY: SHX5326

## 2022-09-20 SURGERY — LARYNGOSCOPY, DIRECT
Anesthesia: General | Site: Throat

## 2022-09-20 MED ORDER — FENTANYL CITRATE (PF) 100 MCG/2ML IJ SOLN
INTRAMUSCULAR | Status: AC
  Filled 2022-09-20: qty 2

## 2022-09-20 MED ORDER — ONDANSETRON HCL 4 MG/2ML IJ SOLN
INTRAMUSCULAR | Status: AC
  Filled 2022-09-20: qty 2

## 2022-09-20 MED ORDER — ROCURONIUM BROMIDE 10 MG/ML (PF) SYRINGE
PREFILLED_SYRINGE | INTRAVENOUS | Status: AC
  Filled 2022-09-20: qty 10

## 2022-09-20 MED ORDER — ACETAMINOPHEN 10 MG/ML IV SOLN
INTRAVENOUS | Status: AC
  Filled 2022-09-20: qty 100

## 2022-09-20 MED ORDER — BACITRACIN ZINC 500 UNIT/GM EX OINT
TOPICAL_OINTMENT | CUTANEOUS | Status: AC
  Filled 2022-09-20: qty 28.35

## 2022-09-20 MED ORDER — SUGAMMADEX SODIUM 200 MG/2ML IV SOLN
INTRAVENOUS | Status: DC | PRN
  Administered 2022-09-20: 200 mg via INTRAVENOUS

## 2022-09-20 MED ORDER — FENTANYL CITRATE (PF) 100 MCG/2ML IJ SOLN
INTRAMUSCULAR | Status: DC | PRN
  Administered 2022-09-20 (×2): 50 ug via INTRAVENOUS

## 2022-09-20 MED ORDER — AMISULPRIDE (ANTIEMETIC) 5 MG/2ML IV SOLN: 10.0000 mg | Freq: Once | INTRAVENOUS | Status: DC | PRN

## 2022-09-20 MED ORDER — ROCURONIUM BROMIDE 100 MG/10ML IV SOLN
INTRAVENOUS | Status: DC | PRN
  Administered 2022-09-20: 30 mg via INTRAVENOUS

## 2022-09-20 MED ORDER — EPINEPHRINE HCL (NASAL) 0.1 % NA SOLN
NASAL | Status: DC | PRN
  Administered 2022-09-20: 10 mL via TOPICAL

## 2022-09-20 MED ORDER — OXYMETAZOLINE HCL 0.05 % NA SOLN
NASAL | Status: AC
  Filled 2022-09-20: qty 30

## 2022-09-20 MED ORDER — LIDOCAINE HCL (CARDIAC) PF 100 MG/5ML IV SOSY
PREFILLED_SYRINGE | INTRAVENOUS | Status: DC | PRN
  Administered 2022-09-20: 60 mg via INTRAVENOUS

## 2022-09-20 MED ORDER — ONDANSETRON HCL 4 MG/2ML IJ SOLN
INTRAMUSCULAR | Status: DC | PRN
  Administered 2022-09-20: 4 mg via INTRAVENOUS

## 2022-09-20 MED ORDER — DEXAMETHASONE SODIUM PHOSPHATE 4 MG/ML IJ SOLN
INTRAMUSCULAR | Status: DC | PRN
  Administered 2022-09-20: 10 mg via INTRAVENOUS

## 2022-09-20 MED ORDER — LACTATED RINGERS IV SOLN: INTRAVENOUS | Status: DC

## 2022-09-20 MED ORDER — DEXAMETHASONE SODIUM PHOSPHATE 10 MG/ML IJ SOLN
INTRAMUSCULAR | Status: AC
  Filled 2022-09-20: qty 1

## 2022-09-20 MED ORDER — ACETAMINOPHEN 10 MG/ML IV SOLN
1000.0000 mg | Freq: Once | INTRAVENOUS | Status: DC | PRN
  Administered 2022-09-20: 1000 mg via INTRAVENOUS

## 2022-09-20 MED ORDER — ONDANSETRON HCL 4 MG/2ML IJ SOLN: 4.0000 mg | Freq: Once | INTRAMUSCULAR | Status: DC | PRN

## 2022-09-20 MED ORDER — LIDOCAINE 2% (20 MG/ML) 5 ML SYRINGE
INTRAMUSCULAR | Status: AC
  Filled 2022-09-20: qty 5

## 2022-09-20 MED ORDER — PROPOFOL 10 MG/ML IV BOLUS
INTRAVENOUS | Status: DC | PRN
  Administered 2022-09-20: 120 mg via INTRAVENOUS

## 2022-09-20 MED ORDER — FENTANYL CITRATE (PF) 100 MCG/2ML IJ SOLN
25.0000 ug | INTRAMUSCULAR | Status: DC | PRN
  Administered 2022-09-20: 50 ug via INTRAVENOUS

## 2022-09-20 MED ORDER — EPINEPHRINE PF 1 MG/ML IJ SOLN
INTRAMUSCULAR | Status: AC
  Filled 2022-09-20: qty 2

## 2022-09-20 MED ORDER — PROPOFOL 500 MG/50ML IV EMUL
INTRAVENOUS | Status: AC
  Filled 2022-09-20: qty 50

## 2022-09-20 MED ORDER — PROPOFOL 500 MG/50ML IV EMUL
INTRAVENOUS | Status: DC | PRN
  Administered 2022-09-20: 50 ug/kg/min via INTRAVENOUS

## 2022-09-20 MED ORDER — LIDOCAINE-EPINEPHRINE 1 %-1:100000 IJ SOLN
INTRAMUSCULAR | Status: AC
  Filled 2022-09-20: qty 3

## 2022-09-20 SURGICAL SUPPLY — 37 items
BLADE SURG 15 STRL LF DISP TIS (BLADE) IMPLANT
BLADE SURG 15 STRL SS (BLADE) ×1
BNDG EYE OVAL 2 1/8 X 2 5/8 (GAUZE/BANDAGES/DRESSINGS) IMPLANT
CANISTER SUCT 1200ML W/VALVE (MISCELLANEOUS) ×2 IMPLANT
CNTNR URN SCR LID CUP LEK RST (MISCELLANEOUS) IMPLANT
CONT SPEC 4OZ STRL OR WHT (MISCELLANEOUS) ×1
COVER MAYO STAND STRL (DRAPES) IMPLANT
GAUZE SPONGE 4X4 12PLY STRL LF (GAUZE/BANDAGES/DRESSINGS) ×4 IMPLANT
GLOVE BIO SURGEON STRL SZ7.5 (GLOVE) ×2 IMPLANT
GLOVE BIOGEL PI IND STRL 7.0 (GLOVE) IMPLANT
GOWN STRL REUS W/ TWL LRG LVL3 (GOWN DISPOSABLE) IMPLANT
GOWN STRL REUS W/ TWL XL LVL3 (GOWN DISPOSABLE) IMPLANT
GOWN STRL REUS W/TWL LRG LVL3 (GOWN DISPOSABLE) ×1
GOWN STRL REUS W/TWL XL LVL3 (GOWN DISPOSABLE) ×1
GUARD TEETH (MISCELLANEOUS) ×2 IMPLANT
MARKER SKIN DUAL TIP RULER LAB (MISCELLANEOUS) IMPLANT
NDL HYPO 18GX1.5 BLUNT FILL (NEEDLE) ×2 IMPLANT
NDL SPNL 22GX7 QUINCKE BK (NEEDLE) IMPLANT
NDL SPNL 25GX3.5 QUINCKE BL (NEEDLE) ×2 IMPLANT
NEEDLE HYPO 18GX1.5 BLUNT FILL (NEEDLE) IMPLANT
NEEDLE SPNL 22GX7 QUINCKE BK (NEEDLE) IMPLANT
NEEDLE SPNL 25GX3.5 QUINCKE BL (NEEDLE) IMPLANT
NS IRRIG 1000ML POUR BTL (IV SOLUTION) ×2 IMPLANT
PACK BASIN DAY SURGERY FS (CUSTOM PROCEDURE TRAY) ×2 IMPLANT
PATTIES SURGICAL .5 X3 (DISPOSABLE) ×2 IMPLANT
SHEET MEDIUM DRAPE 40X70 STRL (DRAPES) ×2 IMPLANT
SLEEVE SCD COMPRESS KNEE MED (STOCKING) IMPLANT
SOL ANTI FOG 6CC (MISCELLANEOUS) ×2 IMPLANT
SURGILUBE 2OZ TUBE FLIPTOP (MISCELLANEOUS) IMPLANT
SYR 5ML LL (SYRINGE) ×2 IMPLANT
SYR BULB EAR ULCER 3OZ GRN STR (SYRINGE) IMPLANT
SYR CONTROL 10ML LL (SYRINGE) IMPLANT
SYR TB 1ML LL NO SAFETY (SYRINGE) IMPLANT
TOWEL GREEN STERILE FF (TOWEL DISPOSABLE) ×2 IMPLANT
TUBE CONNECTING 20X1/4 (TUBING) ×2 IMPLANT
TUBE SALEM SUMP 16F (TUBING) ×2 IMPLANT
YANKAUER SUCT BULB TIP NO VENT (SUCTIONS) IMPLANT

## 2022-09-20 NOTE — Discharge Instructions (Addendum)
[facility] POST OP INSTRUCTIONS: LARYNGOSCOPY  The Surgery Itself Laryngoscopy with biopsy or injection involves a brief general anesthesia, typically for  less than one hour. Patients may be sedated for several hours after surgery and may  remain sleepy for the better part of the day. Nausea and vomiting are occasionally seen,  and usually resolve by the evening of surgery - even without additional medications.  Almost all patients can go home the day of surgery.  After Surgery ? You will have a sore throat from the metal instruments used to allow a good view  of your voice box for 3-5 days after surgery. Some patients may have sores on  the tongue or in the mouth. This is normal and you will be given pain  medications for this. If you have sores in the mouth, avoid citrus or acidic  foods/drinks since they will burn.  ? Avoid coughing or frequent throat clearing. Drinking water can help alleviate the  urge to clear the throat. ? Avoid any heavy lifting (more than 20 lbs), straining, exercise, or sports activities  for 2 weeks after surgery. ? Avoid alcohol, tobacco products, spicy foods, or eating late at night as these may  cause heartburn or stomach reflux and may delay the healing process. ? Your voice may be hoarse after surgery from swelling of the vocal cords caused  by manipulation. ? You do not have to avoid talking unless your surgeon gives you specific  instructions. Use your normal voice since whispering is harder on your vocal  cords then talking at a regular volume. ? If your surgeon advises voice rest, you should do the following: o You are to have absolute voice rest for 7 days. You may want to purchase  a dry erase board to communicate during this time. After that, you may  begin to use your voice at a soft spoken level. o You should only speak loud enough for people to hear you that are within  an arm's length away. Do not yell or whisper. You may increase your   voice use by 5 minutes/hour each day after the first 7 days of rest.  Medications ? Pain medication can be used for pain as prescribed. Pain in the throat and the  tongue is normal. ? Some patients will be given steroids or acid reducing medications after surgery,  take these as directed. ? Take all of your routine medications as prescribed, unless told otherwise by your  surgeon. Any medications that thin the blood should be avoided unless approved  by your surgeon.  ? IT IS OK TO TAKE OVER THE COUNTER PAIN MEDICATION  (IBUPROFEN, NAPROXEN, or ACETAMINOPHEN) IN ADDITION TO  YOUR PRESCRIBED MEDICATIONS. DO NOT TAKE ASPIRIN UNLESS  CLEARED WITH YOUR SURGEON.  ? Limit Acetaminophen/Tylenol to less than 4,000mg /day  ? Limit Ibuprofen/Motrin to less than 3,600mg /day  Final Result ? Following vocal cord injection, the voice may seem "strangled" due to swelling for  a few days or weeks. This is normal.  ? If you have a biopsy in surgery, you will usually find out the pathology results  when you are seen in the office for follow up. ? Many patients benefit from voice therapy after surgery to improve the long term  result. Your surgeon will recommend this if you could benefit from it    Post Anesthesia Home Care Instructions  Activity: Get plenty of rest for the remainder of the day. A responsible individual must stay with you for  24 hours following the procedure.  For the next 24 hours, DO NOT: -Drive a car -Advertising copywriter -Drink alcoholic beverages -Take any medication unless instructed by your physician -Make any legal decisions or sign important papers.  Meals: Start with liquid foods such as gelatin or soup. Progress to regular foods as tolerated. Avoid greasy, spicy, heavy foods. If nausea and/or vomiting occur, drink only clear liquids until the nausea and/or vomiting subsides. Call your physician if vomiting continues.  Special Instructions/Symptoms: Your throat may  feel dry or sore from the anesthesia or the breathing tube placed in your throat during surgery. If this causes discomfort, gargle with warm salt water. The discomfort should disappear within 24 hours.

## 2022-09-20 NOTE — H&P (Signed)
Julia Singleton is an 77 y.o. female.    Chief Complaint:  Asymmetric lingual tonsil hypertrophy  HPI: Patient presents today for planned elective procedure.  He/she denies any interval change in history since office visit on 08/15/22  Past Medical History:  Diagnosis Date   Diverticulitis    Headache    IBS (irritable bowel syndrome)    Osteoporosis     Past Surgical History:  Procedure Laterality Date   BREAST BIOPSY Right    benign   TONSILLECTOMY AND ADENOIDECTOMY      Family History  Problem Relation Age of Onset   Lymphoma Mother    Heart failure Father        Later onset   Migraines Neg Hx     Social History:  reports that she has never smoked. She has never been exposed to tobacco smoke. She has never used smokeless tobacco. She reports current alcohol use. She reports that she does not use drugs.  Allergies:  Allergies  Allergen Reactions   Penicillins Other (See Comments)    Childhood allergy; reaction unknown Skin test positive for reaction to [facility]. Severe dermitis     Pneumococcal Vaccines Hives and Swelling    Medications Prior to Admission  Medication Sig Dispense Refill   ALPRAZolam (XANAX XR) 0.5 MG 24 hr tablet Take 0.5 mg by mouth daily.     CALCIUM CITRATE PO Take by mouth.     cholecalciferol (VITAMIN D) 1000 UNITS tablet Take 2,000 Units by mouth every morning.     gabapentin (NEURONTIN) 100 MG capsule Take 100 mg by mouth 3 (three) times daily. Occipital neuropathy     loratadine (CLARITIN) 10 MG tablet Take 1 tablet by mouth daily.     Multiple Vitamin (MULTIVITAMIN) tablet Take 1 tablet by mouth every morning.      Polyethylene Glycol 3350 (MIRALAX PO) Miralax     raloxifene (EVISTA) 60 MG tablet Take 60 mg by mouth every morning.      fexofenadine (ALLEGRA) 180 MG tablet Take 180 mg by mouth as needed for allergies or rhinitis.     OVER THE COUNTER MEDICATION FD Guard (caraway oil and menthol) one before meals as needed)     Simethicone  (GAS-X PO) Take by mouth.      No results found for this or any previous visit (from the past 48 hour(s)). No results found.  ROS: negative other than stated in HPI  Blood pressure (!) 150/83, pulse 87, temperature 97.7 F (36.5 C), temperature source Oral, resp. rate 16, height 5' 5.5" (1.664 m), weight 51.9 kg, SpO2 100 %.  PHYSICAL EXAM: General: Resting comfortably in NAD  Lungs: Non-labored respiratinos  Studies Reviewed: none   Assessment/Plan Asymmetric lingual tonsil hypertrophy  Proceed with direct laryngoscopy with biopsies. Informed consent obtained.     Electronically signed by:  Scarlette Ar, MD  Staff Physician Facial Plastic & Reconstructive Surgery Otolaryngology - Head and Neck Surgery [facility]  09/20/2022, 8:28 AM

## 2022-09-20 NOTE — Anesthesia Preprocedure Evaluation (Addendum)
Anesthesia Evaluation  Patient identified by MRN, date of birth, ID band Patient awake    Reviewed: Allergy & Precautions, NPO status , Patient's Chart, lab work & pertinent test results  Airway Mallampati: II  TM Distance: >3 FB Neck ROM: Full    Dental no notable dental hx.    Pulmonary neg pulmonary ROS   Pulmonary exam normal        Cardiovascular negative cardio ROS Normal cardiovascular exam     Neuro/Psych  Headaches  Neuromuscular disease  negative psych ROS   GI/Hepatic Neg liver ROS,,,IBS (irritable bowel syndrome)   Endo/Other  negative endocrine ROS    Renal/GU negative Renal ROS     Musculoskeletal negative musculoskeletal ROS (+)    Abdominal   Peds  Hematology negative hematology ROS (+)   Anesthesia Other Findings Lingual tonsil hypertrophy  Reproductive/Obstetrics                             Anesthesia Physical Anesthesia Plan  ASA: 2  Anesthesia Plan: General   Post-op Pain Management:    Induction: Intravenous  PONV Risk Score and Plan: 3 and Ondansetron, Dexamethasone, Treatment may vary due to age or medical condition and Propofol infusion  Airway Management Planned: Oral ETT  Additional Equipment:   Intra-op Plan:   Post-operative Plan: Extubation in OR  Informed Consent: I have reviewed the patients History and Physical, chart, labs and discussed the procedure including the risks, benefits and alternatives for the proposed anesthesia with the patient or authorized representative who has indicated his/her understanding and acceptance.     Dental advisory given  Plan Discussed with: CRNA  Anesthesia Plan Comments:        Anesthesia Quick Evaluation

## 2022-09-20 NOTE — Anesthesia Procedure Notes (Signed)
Procedure Name: Intubation Date/Time: 09/20/2022 8:53 AM  Performed by: Burna Cash, CRNAPre-anesthesia Checklist: Patient identified, Emergency Drugs available, Suction available and Patient being monitored Patient Re-evaluated:Patient Re-evaluated prior to induction Oxygen Delivery Method: Circle system utilized Preoxygenation: Pre-oxygenation with 100% oxygen Induction Type: IV induction Ventilation: Mask ventilation without difficulty Laryngoscope Size: Mac and 3 Grade View: Grade II Tube type: Oral Tube size: 7.0 mm Number of attempts: 1 Airway Equipment and Method: Stylet and Oral airway Placement Confirmation: ETT inserted through vocal cords under direct vision, positive ETCO2 and breath sounds checked- equal and bilateral Secured at: 20 cm Tube secured with: Tape Dental Injury: Teeth and Oropharynx as per pre-operative assessment

## 2022-09-20 NOTE — Transfer of Care (Signed)
Immediate Anesthesia Transfer of Care Note  Patient: Julia Singleton  Procedure(s) Performed: DIRECT LARYNGOSCOPY WITH BIOPSIES (Throat)  Patient Location: PACU  Anesthesia Type:General  Level of Consciousness: awake, alert , and oriented  Airway & Oxygen Therapy: Patient Spontanous Breathing and Patient connected to face mask oxygen  Post-op Assessment: Report given to RN and Post -op Vital signs reviewed and stable  Post vital signs: Reviewed and stable  Last Vitals:  Vitals Value Taken Time  BP 156/71 09/20/22 0930  Temp 36.2 C 09/20/22 0929  Pulse 78 09/20/22 0930  Resp 16 09/20/22 0930  SpO2 99 % 09/20/22 0930  Vitals shown include unvalidated device data.  Last Pain:  Vitals:   09/20/22 0715  TempSrc: Oral  PainSc: 0-No pain      Patients Stated Pain Goal: 3 (09/20/22 0715)  Complications: No notable events documented.

## 2022-09-20 NOTE — Anesthesia Postprocedure Evaluation (Signed)
Anesthesia Post Note  Patient: Julia Singleton  Procedure(s) Performed: DIRECT LARYNGOSCOPY WITH BIOPSIES (Throat)     Patient location during evaluation: PACU Anesthesia Type: General Level of consciousness: awake Pain management: pain level controlled Vital Signs Assessment: post-procedure vital signs reviewed and stable Respiratory status: spontaneous breathing, nonlabored ventilation and respiratory function stable Cardiovascular status: blood pressure returned to baseline and stable Postop Assessment: no apparent nausea or vomiting Anesthetic complications: no   No notable events documented.  Last Vitals:  Vitals:   09/20/22 1000 09/20/22 1023  BP: (!) 156/76 (!) 154/80  Pulse: 70 69  Resp: 13 15  Temp:  (!) 36.3 C  SpO2: 94% 93%    Last Pain:  Vitals:   09/20/22 1023  TempSrc: Temporal  PainSc: 3                  Jomo Forand P Shivani Barrantes

## 2022-09-20 NOTE — Op Note (Signed)
OPERATIVE NOTE  Julia Singleton Date/Time of Admission: 09/20/2022  6:55 AM  CSN: [id number];NGE:[id number] Attending Provider: Scarlette Ar, MD Room/Bed: [facility]/NONE DOB: 06/03/1946 Age: 77 y.o.   Pre-Op Diagnosis: Lingual tonsil hypertrophy  Post-Op Diagnosis: Lingual tonsil hypertrophy  Procedure: Procedure(s): DIRECT LARYNGOSCOPY WITH BIOPSIES  Anesthesia: General  Surgeon(s): Mervin Kung, MD  Staff: Circulator: Lenn Cal, RN; Janace Aris, RN Scrub Person: Lilia Argue J  Implants: * No implants in log *  Specimens: ID Type Source Tests Collected by Time Destination  1 : RIGHT LINGUAL TONSIL Tissue PATH Other SURGICAL PATHOLOGY Scarlette Ar, MD 09/20/2022 [phone]   2 : LEFT LINGUAL TONSIL Tissue PATH Other SURGICAL PATHOLOGY Scarlette Ar, MD 09/20/2022 0907     Complications: none  EBL: minimal ML  IVF: Per anesthesia ML  Condition: stable  Operative Findings:  Bilateral R>L lingual tonsil hypertrophy   Description of Operation: The patient was brought to the operating room on 09/20/2022 and placed in supine position on the operating table. General endotracheal anesthesia was established without difficulty. When the patient was adequately anesthetized, surgical timeout was performed and correct identification of the patient and the surgical procedure. The patient was positioned and prepped and draped in sterile fashion.  A laryngoscope was used to examine the patient's oral cavity, oropharynx and larynx.  Findings include lingual tonsil hypertrophy r>L. Bilateral biopsies were was taken with an emphasis on the right, and sent fresh for analysis. Hemostasis achieved with 1:1000 epinephrine soaked cottonoid pledgets.  There was no significant bleeding and the patient's airway was stable.  An orogastric tube was passed and stomach contents were aspirated. Patient was awakened from anesthetic and transferred from the operating room to the recovery  room in stable condition. There were no complications and blood loss was minimal.    Mervin Kung, MD [facility]  09/20/2022

## 2022-09-21 ENCOUNTER — Encounter (HOSPITAL_BASED_OUTPATIENT_CLINIC_OR_DEPARTMENT_OTHER): Payer: Self-pay | Admitting: Otolaryngology

## 2022-09-21 LAB — SURGICAL PATHOLOGY

## 2022-09-25 DIAGNOSIS — H02102 Unspecified ectropion of right lower eyelid: Secondary | ICD-10-CM | POA: Diagnosis not present

## 2022-09-25 DIAGNOSIS — H04123 Dry eye syndrome of bilateral lacrimal glands: Secondary | ICD-10-CM | POA: Diagnosis not present

## 2022-09-25 DIAGNOSIS — H52203 Unspecified astigmatism, bilateral: Secondary | ICD-10-CM | POA: Diagnosis not present

## 2022-09-25 DIAGNOSIS — Z961 Presence of intraocular lens: Secondary | ICD-10-CM | POA: Diagnosis not present

## 2022-09-25 DIAGNOSIS — H524 Presbyopia: Secondary | ICD-10-CM | POA: Diagnosis not present

## 2022-09-25 DIAGNOSIS — H02105 Unspecified ectropion of left lower eyelid: Secondary | ICD-10-CM | POA: Diagnosis not present

## 2022-10-03 DIAGNOSIS — K63829 Intestinal methanogen overgrowth, unspecified: Secondary | ICD-10-CM | POA: Diagnosis not present

## 2022-10-03 DIAGNOSIS — K638219 Small intestinal bacterial overgrowth, unspecified: Secondary | ICD-10-CM | POA: Diagnosis not present

## 2022-10-06 ENCOUNTER — Ambulatory Visit (HOSPITAL_BASED_OUTPATIENT_CLINIC_OR_DEPARTMENT_OTHER)
Admission: RE | Admit: 2022-10-06 | Discharge: 2022-10-06 | Disposition: A | Discharge status: Home / Self Care | Payer: Medicare HMO | Source: Ambulatory Visit | Attending: Internal Medicine | Admitting: Internal Medicine

## 2022-10-06 ENCOUNTER — Encounter (HOSPITAL_BASED_OUTPATIENT_CLINIC_OR_DEPARTMENT_OTHER): Payer: Self-pay | Admitting: Radiology

## 2022-10-06 ENCOUNTER — Other Ambulatory Visit (HOSPITAL_BASED_OUTPATIENT_CLINIC_OR_DEPARTMENT_OTHER): Payer: Self-pay

## 2022-10-06 DIAGNOSIS — Z1231 Encounter for screening mammogram for malignant neoplasm of breast: Secondary | ICD-10-CM | POA: Diagnosis not present

## 2022-10-06 DIAGNOSIS — J351 Hypertrophy of tonsils: Secondary | ICD-10-CM | POA: Diagnosis not present

## 2022-10-24 ENCOUNTER — Other Ambulatory Visit (HOSPITAL_BASED_OUTPATIENT_CLINIC_OR_DEPARTMENT_OTHER): Payer: Self-pay

## 2022-10-24 MED ORDER — COMIRNATY 30 MCG/0.3ML IM SUSY
0.3000 mL | PREFILLED_SYRINGE | INTRAMUSCULAR | 0 refills | Status: AC
  Filled 2022-10-24: qty 0.3, 1d supply, fill #0

## 2022-11-10 DIAGNOSIS — M810 Age-related osteoporosis without current pathological fracture: Secondary | ICD-10-CM | POA: Diagnosis not present

## 2022-11-10 DIAGNOSIS — R002 Palpitations: Secondary | ICD-10-CM | POA: Diagnosis not present

## 2022-11-10 DIAGNOSIS — K5900 Constipation, unspecified: Secondary | ICD-10-CM | POA: Diagnosis not present

## 2022-11-10 DIAGNOSIS — K6389 Other specified diseases of intestine: Secondary | ICD-10-CM | POA: Diagnosis not present

## 2022-11-10 DIAGNOSIS — R413 Other amnesia: Secondary | ICD-10-CM | POA: Diagnosis not present

## 2022-11-10 DIAGNOSIS — I700 Atherosclerosis of aorta: Secondary | ICD-10-CM | POA: Diagnosis not present

## 2022-11-10 DIAGNOSIS — R519 Headache, unspecified: Secondary | ICD-10-CM | POA: Diagnosis not present

## 2022-11-10 DIAGNOSIS — R946 Abnormal results of thyroid function studies: Secondary | ICD-10-CM | POA: Diagnosis not present

## 2022-11-10 DIAGNOSIS — F329 Major depressive disorder, single episode, unspecified: Secondary | ICD-10-CM | POA: Diagnosis not present

## 2023-03-06 DIAGNOSIS — R140 Abdominal distension (gaseous): Secondary | ICD-10-CM | POA: Diagnosis not present

## 2023-03-06 DIAGNOSIS — K582 Mixed irritable bowel syndrome: Secondary | ICD-10-CM | POA: Diagnosis not present

## 2023-03-08 DIAGNOSIS — Z1389 Encounter for screening for other disorder: Secondary | ICD-10-CM | POA: Diagnosis not present

## 2023-03-08 DIAGNOSIS — E042 Nontoxic multinodular goiter: Secondary | ICD-10-CM | POA: Diagnosis not present

## 2023-03-08 DIAGNOSIS — Z1212 Encounter for screening for malignant neoplasm of rectum: Secondary | ICD-10-CM | POA: Diagnosis not present

## 2023-03-08 DIAGNOSIS — Z0189 Encounter for other specified special examinations: Secondary | ICD-10-CM | POA: Diagnosis not present

## 2023-03-08 DIAGNOSIS — E781 Pure hyperglyceridemia: Secondary | ICD-10-CM | POA: Diagnosis not present

## 2023-03-08 DIAGNOSIS — Z0000 Encounter for general adult medical examination without abnormal findings: Secondary | ICD-10-CM | POA: Diagnosis not present

## 2023-03-08 DIAGNOSIS — M810 Age-related osteoporosis without current pathological fracture: Secondary | ICD-10-CM | POA: Diagnosis not present

## 2023-03-13 DIAGNOSIS — R82998 Other abnormal findings in urine: Secondary | ICD-10-CM | POA: Diagnosis not present

## 2023-03-15 DIAGNOSIS — F329 Major depressive disorder, single episode, unspecified: Secondary | ICD-10-CM | POA: Diagnosis not present

## 2023-03-15 DIAGNOSIS — Z0000 Encounter for general adult medical examination without abnormal findings: Secondary | ICD-10-CM | POA: Diagnosis not present

## 2023-03-15 DIAGNOSIS — R413 Other amnesia: Secondary | ICD-10-CM | POA: Diagnosis not present

## 2023-03-15 DIAGNOSIS — K589 Irritable bowel syndrome without diarrhea: Secondary | ICD-10-CM | POA: Diagnosis not present

## 2023-03-15 DIAGNOSIS — E042 Nontoxic multinodular goiter: Secondary | ICD-10-CM | POA: Diagnosis not present

## 2023-03-15 DIAGNOSIS — R030 Elevated blood-pressure reading, without diagnosis of hypertension: Secondary | ICD-10-CM | POA: Diagnosis not present

## 2023-03-15 DIAGNOSIS — K219 Gastro-esophageal reflux disease without esophagitis: Secondary | ICD-10-CM | POA: Diagnosis not present

## 2023-03-15 DIAGNOSIS — I700 Atherosclerosis of aorta: Secondary | ICD-10-CM | POA: Diagnosis not present

## 2023-03-15 DIAGNOSIS — Z1331 Encounter for screening for depression: Secondary | ICD-10-CM | POA: Diagnosis not present

## 2023-03-15 DIAGNOSIS — R636 Underweight: Secondary | ICD-10-CM | POA: Diagnosis not present

## 2023-03-15 DIAGNOSIS — Z1389 Encounter for screening for other disorder: Secondary | ICD-10-CM | POA: Diagnosis not present

## 2023-03-28 ENCOUNTER — Other Ambulatory Visit (HOSPITAL_BASED_OUTPATIENT_CLINIC_OR_DEPARTMENT_OTHER): Payer: Self-pay

## 2023-03-28 MED ORDER — HYOSCYAMINE SULFATE 0.125 MG SL SUBL
0.1250 mg | SUBLINGUAL_TABLET | Freq: Four times a day (QID) | SUBLINGUAL | 1 refills | Status: DC | PRN
  Filled 2023-03-28: qty 60, 15d supply, fill #0
  Filled 2023-04-18: qty 60, 15d supply, fill #1

## 2023-03-29 ENCOUNTER — Other Ambulatory Visit (HOSPITAL_BASED_OUTPATIENT_CLINIC_OR_DEPARTMENT_OTHER): Payer: Self-pay

## 2023-03-29 MED ORDER — COMIRNATY 30 MCG/0.3ML IM SUSY
0.3000 mL | PREFILLED_SYRINGE | Freq: Once | INTRAMUSCULAR | 0 refills | Status: AC
  Filled 2023-03-29: qty 0.3, 1d supply, fill #0

## 2023-03-29 MED ORDER — FLUAD 0.5 ML IM SUSY
0.5000 mL | PREFILLED_SYRINGE | Freq: Once | INTRAMUSCULAR | 0 refills | Status: AC
  Filled 2023-03-29: qty 0.5, 1d supply, fill #0

## 2023-04-04 ENCOUNTER — Other Ambulatory Visit (HOSPITAL_BASED_OUTPATIENT_CLINIC_OR_DEPARTMENT_OTHER): Payer: Self-pay

## 2023-04-04 MED ORDER — RSVPREF3 VAC RECOMB ADJUVANTED 120 MCG/0.5ML IM SUSR
0.5000 mL | Freq: Once | INTRAMUSCULAR | 0 refills | Status: AC
  Filled 2023-04-04: qty 0.5, 1d supply, fill #0

## 2023-04-04 MED ORDER — RALOXIFENE HCL 60 MG PO TABS
60.0000 mg | ORAL_TABLET | Freq: Every day | ORAL | 3 refills | Status: DC
  Filled 2023-04-04: qty 90, 90d supply, fill #0
  Filled 2023-06-28: qty 90, 90d supply, fill #1
  Filled 2023-09-27: qty 90, 90d supply, fill #2
  Filled 2023-12-26: qty 90, 90d supply, fill #3

## 2023-05-07 ENCOUNTER — Encounter (HOSPITAL_BASED_OUTPATIENT_CLINIC_OR_DEPARTMENT_OTHER): Payer: Self-pay

## 2023-05-07 ENCOUNTER — Other Ambulatory Visit: Payer: Self-pay

## 2023-05-07 ENCOUNTER — Emergency Department (HOSPITAL_BASED_OUTPATIENT_CLINIC_OR_DEPARTMENT_OTHER)
Admission: EM | Admit: 2023-05-07 | Discharge: 2023-05-07 | Disposition: A | Discharge status: Home / Self Care | Payer: Medicare HMO | Attending: Emergency Medicine | Admitting: Emergency Medicine

## 2023-05-07 ENCOUNTER — Telehealth: Payer: Self-pay | Admitting: Cardiology

## 2023-05-07 DIAGNOSIS — R002 Palpitations: Secondary | ICD-10-CM | POA: Insufficient documentation

## 2023-05-07 DIAGNOSIS — Z79899 Other long term (current) drug therapy: Secondary | ICD-10-CM | POA: Diagnosis not present

## 2023-05-07 LAB — BASIC METABOLIC PANEL
Anion gap: 9 (ref 5–15)
BUN: 16 mg/dL (ref 8–23)
CO2: 25 mmol/L (ref 22–32)
Calcium: 9 mg/dL (ref 8.9–10.3)
Chloride: 100 mmol/L (ref 98–111)
Creatinine, Ser: 0.79 mg/dL (ref 0.44–1.00)
GFR, Estimated: >60 mL/min (ref >60)
Glucose, Bld: 96 mg/dL (ref 70–99)
Potassium: 4.1 mmol/L (ref 3.5–5.1)
Sodium: 134 mmol/L — ABNORMAL LOW (ref 135–145)

## 2023-05-07 LAB — CBC
HCT: 40.9 % (ref 36.0–46.0)
Hemoglobin: 13.9 g/dL (ref 12.0–15.0)
MCH: 30.5 pg (ref 26.0–34.0)
MCHC: 34 g/dL (ref 30.0–36.0)
MCV: 89.9 fL (ref 80.0–100.0)
Platelets: 313 10*3/uL (ref 150–400)
RBC: 4.55 MIL/uL (ref 3.87–5.11)
RDW: 13.2 % (ref 11.5–15.5)
WBC: 10.2 10*3/uL (ref 4.0–10.5)
nRBC: 0 % (ref 0.0–0.2)

## 2023-05-07 NOTE — Discharge Instructions (Signed)
As discussed, your EKG and labs here are unremarkable. Please follow up with your cardiologist as scheduled tomorrow.  Get help right away if: You have chest pain. You feel short of breath. You have a very bad headache. You feel dizzy. You faint.

## 2023-05-07 NOTE — Telephone Encounter (Signed)
Called patient left message on personal voice mail to call back. 

## 2023-05-07 NOTE — ED Notes (Signed)
Pt given discharge instructions and pt to follow-up with cardiologist tomorrow. Opportunities given for questions. Pt verbalizes understanding. Jillyn Hidden, RN

## 2023-05-07 NOTE — Telephone Encounter (Signed)
Spoke to patient she stated she is presently at [facility] ED.Stated her apple watch has been showing she is in afib.Appointment scheduled with Dr.Hochrein 12/3 at 3:40 pm.

## 2023-05-07 NOTE — Telephone Encounter (Signed)
  Per MyChart scheduling message:  Initial complaint:  Heart racing 04/14/2023, clocked at 176 bpm by Apple watch EKG app, lasting ~5 minutes. Since then have had extremely frequent PVC episodes, lots of irregular heartbeats, e.g. after 20th heartbeat, then 9th, then 17th, etc.... Not improving with time.   Patient c/o Palpitations:  STAT if patient reporting lightheadedness, shortness of breath, or chest pain  How long have you had palpitations/irregular HR/ Afib? Are you having the symptoms now?   Are you currently experiencing lightheadedness, SOB or CP?   Do you have a history of afib (atrial fibrillation) or irregular heart rhythm?   Have you checked your BP or HR? (document readings if available):   Are you experiencing any other symptoms?    1. Saw Dr. Antoine Poche 02/16/22 for same problem. For the previous year, give or take, it disappeared until tachycardia 176 bpm 04/14/23. Since then have had the irregular heart rate every day for extended periods, but not constantly.  2. No. 3. No afib. Yes irregular. Latest ECG reading on Apple watch says showing signs of afib. This is the first time it's said that.  4. 11/22: 119/72 P99.  11/26:153/83  P84.  126/68 P92.  11/28: 120/73 P101 11/30:143/81 P83.  128/73 P:101  12/2 142/78 P96  Pulse on ECG since 11/9: in the 80s and 90s.  I tried to send the readings to you from the watch, but you are not listed under the drop-down provider menu.  5. Anxiety!

## 2023-05-07 NOTE — Progress Notes (Unsigned)
Cardiology Office Note:   Date:  05/08/2023  ID:  Julia Singleton, DOB 1945-10-13, MRN [id number] PCP: Creola Corn, MD  [facility] Providers Cardiologist:  Rollene Rotunda, MD {   History of Present Illness:   Julia Singleton is a 77 y.o. female  with a history of palpitations and IBS who is followed y Dr. Antoine Poche and presents today for follow-up of palpitations.   The patient was referred in 02/2022 for further evaluation of palpitations. An event monitor was ordered for further evaluation and showed underlying sinus rhythm with one 5 beat run of NSVT but no significant arrhythmias. Her BP was noted to be mildly elevated but patient reported that it was usually well controlled so she was not started on any medications for this.   She called the other day and had atrial fib on her Apple Watch.  I reviewed these strips and it is difficult to tell.  She was in the emergency room a couple of days ago and her EKG was sinus rhythm.  I reviewed these records.  There were no significant abnormalities.  She said that her symptoms really started on November 9.  She had about 5 minutes of her heart rate being in the 170s.  Since then she has felt it skipping more.  She is felt not any of the sustained tacky arrhythmias but lots of skipped beats.  She has not had any presyncope or syncope.  He is here happening unprovoked.  She does use caffeine.  She has been under more emotional stress.  She denies any chest pressure, neck or arm discomfort.  She has had no weight gain or edema.  She walks routinely for exercise.   ROS: As stated in the HPI and negative for all other systems.  Studies Reviewed:    EKG:   EKG Interpretation Date/Time:  Tuesday May 08 2023 15:59:33 EST Ventricular Rate:  82 PR Interval:  130 QRS Duration:  86 QT Interval:  378 QTC Calculation: 441 R Axis:   70  Text Interpretation: Normal sinus rhythm When compared with ECG of 07-May-2023 12:32, No significant change  was found Confirmed by Rollene Rotunda ([id number]) on 05/08/2023 4:10:15 PM     Risk Assessment/Calculations:      Physical Exam:   VS:  BP (!) 162/88 (BP Location: Left Arm, Patient Position: Sitting, Cuff Size: Normal)   Pulse 82   Ht 5\' 5"  (1.651 m)   Wt 117 lb (53.1 kg)   SpO2 99%   BMI 19.47 kg/m    Wt Readings from Last 3 Encounters:  05/08/23 117 lb (53.1 kg)  05/07/23 114 lb 10.2 oz (52 kg)  09/20/22 114 lb 6.7 oz (51.9 kg)     GEN: Well nourished, well developed in no acute distress NECK: No JVD; No carotid bruits CARDIAC: RRR, no murmurs, rubs, gallops RESPIRATORY:  Clear to auscultation without rales, wheezing or rhonchi  ABDOMEN: Soft, non-tender, non-distended EXTREMITIES:  No edema; No deformity   ASSESSMENT AND PLAN:   Palpitations: I cannot exclude atrial fibrillation and we will have her wear a 2-week monitor.  I do not suspect sustained fibrillation if she is having it at this point I will not add a DOAC.  I also think she is having premature ectopic beats and she would prefer not to treat this if possible.  I will quantify any ectopy that she has on a 2-week monitor.  I do note that she has had normal TSH earlier this year.  Electrolytes were emergency room.  She does have a lot of stress and anxiety untreated and we talked about management of that.  Borderline Hypertension: Blood pressure is usually well-controlled at home.  She is very agitated about being here.  She is can keep a blood pressure diary and I will have a low threshold to uptitrate her medications based on these results.  Follow up I would like to come back and see a APP after she has 1 to 2-week monitor.  She should bring her blood pressure diary at that time  Signed, Rollene Rotunda, MD

## 2023-05-07 NOTE — ED Triage Notes (Addendum)
Patient arrives ambulatory to ED with complaints of frequent heart palpitations. Patient states that the palpitations were intermittent, but have become constant today.  Reports no chest pain.

## 2023-05-07 NOTE — ED Provider Notes (Signed)
[place] EMERGENCY DEPARTMENT AT [facility] Provider Note   CSN: [id number] Arrival date & time: 05/07/23  1225     History  Chief Complaint  Patient presents with   Palpitations    Julia Singleton is a 77 y.o. female with a history of IBS, osteoporosis, and anxiety who presents the ED today for palpitations. Patient reports palpations that began this morning while putting up Christmas decorations and have persisted until she was in here in the waiting room at the ED. She used her apple watch to perform an EKG.  She showed me multiple EKGs some of which showed normal sinus rhythm, somewhat inconclusive, and one showed atrial fibrillation. There appeared to be artifact on the EKG labeled afib.    Additionally, patient reports that she has been having intermittent palpitations since 04/14/23.  She came in today because she was concerned by the EKG labeling the palpitations as atrial fibrillation.  She denies associated chest pain or shortness of breath.  Patient has a history of palpitations.  She states that she had similar symptoms about a year ago, and was evaluated by cardiology in the outpatient setting.  She wore a heart monitor for a month, with ultimately normal results.  She did not wanted to be started on beta-blockers at the time.  She has not followed up with them since because she was feeling fine until last month.    Home Medications Prior to Admission medications   Medication Sig Start Date End Date Taking? Authorizing Provider  ALPRAZolam (XANAX XR) 0.5 MG 24 hr tablet Take 0.5 mg by mouth daily.    [provider]  CALCIUM CITRATE PO Take by mouth.    [provider]  cholecalciferol (VITAMIN D) 1000 UNITS tablet Take 2,000 Units by mouth every morning.    [provider]  COVID-19 mRNA vaccine [phone] (COMIRNATY) syringe Inject 0.3 mLs into the muscle. 10/24/22   Judyann Munson, MD  fexofenadine (ALLEGRA) 180 MG tablet Take 180 mg by  mouth as needed for allergies or rhinitis.    [provider]  gabapentin (NEURONTIN) 100 MG capsule Take 100 mg by mouth 3 (three) times daily. Occipital neuropathy    [provider]  hyoscyamine (LEVSIN SL) 0.125 MG SL tablet Place 1 tablet (0.125 mg total) under the tongue every 6 (six) hours as needed (abdominal cramping) 03/28/23     loratadine (CLARITIN) 10 MG tablet Take 1 tablet by mouth daily.    [provider]  Multiple Vitamin (MULTIVITAMIN) tablet Take 1 tablet by mouth every morning.     [provider]  OVER THE COUNTER MEDICATION FD Guard (caraway oil and menthol) one before meals as needed)    [provider]  Polyethylene Glycol 3350 (MIRALAX PO) Miralax    [provider]  raloxifene (EVISTA) 60 MG tablet Take 60 mg by mouth every morning.     [provider]  raloxifene (EVISTA) 60 MG tablet Take 1 tablet (60 mg total) by mouth daily. 04/04/23     Simethicone (GAS-X PO) Take by mouth.    [provider]      Allergies    Penicillins and Pneumococcal vaccines    Review of Systems   Review of Systems  Cardiovascular:  Positive for palpitations.  All other systems reviewed and are negative.   Physical Exam Updated Vital Signs BP (!) 158/76   Pulse 72   Temp 97.8 F (36.6 C)   Resp 15   Ht  5' 5.5" (1.664 m)   Wt 52 kg   SpO2 99%   BMI 18.79 kg/m  Physical Exam Vitals and nursing note reviewed.  Constitutional:      General: She is not in acute distress.    Appearance: Normal appearance.  HENT:     Head: Normocephalic and atraumatic.     Mouth/Throat:     Mouth: Mucous membranes are moist.  Eyes:     Conjunctiva/sclera: Conjunctivae normal.     Pupils: Pupils are equal, round, and reactive to light.  Cardiovascular:     Rate and Rhythm: Normal rate and regular rhythm.     Pulses: Normal pulses.     Heart sounds: Normal heart sounds.  Pulmonary:     Effort: Pulmonary effort is  normal.     Breath sounds: Normal breath sounds.  Abdominal:     Palpations: Abdomen is soft.     Tenderness: There is no abdominal tenderness.  Skin:    General: Skin is warm and dry.     Findings: No rash.  Neurological:     General: No focal deficit present.     Mental Status: She is alert.  Psychiatric:        Mood and Affect: Mood normal.        Behavior: Behavior normal.    ED Results / Procedures / Treatments   Labs (all labs ordered are listed, but only abnormal results are displayed) Labs Reviewed  BASIC METABOLIC PANEL - Abnormal; Notable for the following components:      Result Value   Sodium 134 (*)    All other components within normal limits  CBC  CBG MONITORING, ED    EKG EKG Interpretation Date/Time:  Monday May 07 2023 12:32:19 EST Ventricular Rate:  94 PR Interval:  128 QRS Duration:  88 QT Interval:  366 QTC Calculation: 457 R Axis:   81  Text Interpretation: Normal sinus rhythm Normal ECG When compared with ECG of 13-Feb-2022 15:45, No significant change since last tracing Confirmed by Alvira Monday ([id number]) on 05/07/2023 2:35:15 PM  Radiology No results found.  Procedures Procedures: not indicated.   Medications Ordered in ED Medications - No data to display  ED Course/ Medical Decision Making/ A&P                                 Medical Decision Making Amount and/or Complexity of Data Reviewed Labs: ordered.   This patient presents to the ED for concern of palpitations, this involves an extensive number of treatment options, and is a complaint that carries with it a high risk of complications and morbidity.   Differential diagnosis includes: ACS, pulmonary embolism, cardiac arrhythmia, palpitations, electrolyte abnormality, anxiety, etc. Low suspicion for ACS and pulmonary embolism due to lack of chest pain, shortness of breath, tachycardia, and that symptoms have improved.   Comorbidities  See HPI above   Additional  History  Additional history obtained from cardiology.   Cardiac Monitoring / EKG  The patient was maintained on a cardiac monitor.  I personally viewed and interpreted the cardiac monitored which showed: NSR with a heart rate of 94 bpm.   Lab Tests  I ordered and personally interpreted labs.  The pertinent results include:   BMP and CBC are within normal limits no acute electrolyte derangement, AKI, anemia, or infection.   Problem List / ED Course / Critical Interventions / Medication Management  Palpitations  Patient is asymptomatic, no  I have reviewed the patients home medicines and have made adjustments as needed   Social Determinants of Health  Housing    Test / Admission - Considered  Discussed findings with patient husband at bedside. She is hemodynamically stable and safe for discharge home.  Advised to keep appoint with cardiology tomorrow. Return precautions given.       Final Clinical Impression(s) / ED Diagnoses Final diagnoses:  Palpitations    Rx / DC Orders ED Discharge Orders     None         Maxwell Marion, PA-C 05/07/23 1602    Alvira Monday, MD 05/08/23 [phone]

## 2023-05-08 ENCOUNTER — Ambulatory Visit (INDEPENDENT_AMBULATORY_CARE_PROVIDER_SITE_OTHER): Payer: Medicare HMO

## 2023-05-08 ENCOUNTER — Encounter: Payer: Self-pay | Admitting: Cardiology

## 2023-05-08 ENCOUNTER — Ambulatory Visit: Payer: Medicare HMO | Attending: Cardiology | Admitting: Cardiology

## 2023-05-08 VITALS — BP 162/88 | HR 82 | Ht 65.0 in | Wt 117.0 lb

## 2023-05-08 DIAGNOSIS — R002 Palpitations: Secondary | ICD-10-CM

## 2023-05-08 DIAGNOSIS — I10 Essential (primary) hypertension: Secondary | ICD-10-CM

## 2023-05-08 NOTE — Addendum Note (Signed)
Addended by: Jeannette How A on: 05/08/2023 05:07 PM   Modules accepted: Orders

## 2023-05-08 NOTE — Patient Instructions (Signed)
Medication Instructions:  No changes. *If you need a refill on your cardiac medications before your next appointment, please call your pharmacy*   Testing/Procedures: ZIO XT- Long Term Monitor Instructions  Your physician has requested you wear a ZIO patch monitor for 14 days.  This is a single patch monitor. [employer] supplies one patch monitor per enrollment. Additional stickers are not available. Please do not apply patch if you will be having a Nuclear Stress Test,  Echocardiogram, Cardiac CT, MRI, or Chest Xray during the period you would be wearing the  monitor. The patch cannot be worn during these tests. You cannot remove and re-apply the  ZIO XT patch monitor.  Your ZIO patch monitor will be mailed 3 day USPS to your address on file. It may take 3-5 days  to receive your monitor after you have been enrolled.  Once you have received your monitor, please review the enclosed instructions. Your monitor  has already been registered assigning a specific monitor serial # to you.  Billing and Patient Assistance Program Information  We have supplied [employer] with any of your insurance information on file for billing purposes. [employer] offers a sliding scale Patient Assistance Program for patients that do not have  insurance, or whose insurance does not completely cover the cost of the ZIO monitor.  You must apply for the Patient Assistance Program to qualify for this discounted rate.  To apply, please call [employer] at [phone], select option 4, select option 2, ask to apply for  Patient Assistance Program. Meredeth Ide will ask your household income, and how many people  are in your household. They will quote your out-of-pocket cost based on that information.  [employer] will also be able to set up a 49-month, interest-free payment plan if needed.  Applying the monitor   Shave hair from upper left chest.  Hold abrader disc by orange tab. Rub abrader in 40 strokes over the upper left chest as   indicated in your monitor instructions.  Clean area with 4 enclosed alcohol pads. Let dry.  Apply patch as indicated in monitor instructions. Patch will be placed under collarbone on left  side of chest with arrow pointing upward.  Rub patch adhesive wings for 2 minutes. Remove white label marked "1". Remove the white  label marked "2". Rub patch adhesive wings for 2 additional minutes.  While looking in a mirror, press and release button in center of patch. A small green light will  flash 3-4 times. This will be your only indicator that the monitor has been turned on.  Do not shower for the first 24 hours. You may shower after the first 24 hours.  Press the button if you feel a symptom. You will hear a small click. Record Date, Time and  Symptom in the Patient Logbook.  When you are ready to remove the patch, follow instructions on the last 2 pages of Patient  Logbook. Stick patch monitor onto the last page of Patient Logbook.  Place Patient Logbook in the blue and white box. Use locking tab on box and tape box closed  securely. The blue and white box has prepaid postage on it. Please place it in the mailbox as  soon as possible. Your physician should have your test results approximately 7 days after the  monitor has been mailed back to [facility].  Call [facility] Customer Care at [phone] if you have questions regarding  your ZIO XT patch monitor. Call them immediately if you see an orange light  blinking on your  monitor.  If your monitor falls off in less than 4 days, contact our Monitor department at [phone].  If your monitor becomes loose or falls off after 4 days call [employer] at [phone] for  suggestions on securing your monitor    Follow-Up: At [facility], you and your health needs are our priority.  As part of our continuing mission to provide you with exceptional heart care, we have created designated Provider Care Teams.  These Care Teams  include your primary Cardiologist (physician) and Advanced Practice Providers (APPs -  Physician Assistants and Nurse Practitioners) who all work together to provide you with the care you need, when you need it.  Your next appointment:   6 week(s)  Provider:   Any APP.    Other Instructions Keep blood pressure log as directed by MD.

## 2023-05-08 NOTE — Progress Notes (Unsigned)
Enrolled patient for a 14 day Zio XT  monitor to be mailed to patients home  °

## 2023-05-11 ENCOUNTER — Other Ambulatory Visit (HOSPITAL_BASED_OUTPATIENT_CLINIC_OR_DEPARTMENT_OTHER): Payer: Self-pay

## 2023-05-12 DIAGNOSIS — R002 Palpitations: Secondary | ICD-10-CM | POA: Diagnosis not present

## 2023-05-13 ENCOUNTER — Encounter: Payer: Self-pay | Admitting: Cardiology

## 2023-05-14 ENCOUNTER — Other Ambulatory Visit: Payer: Self-pay

## 2023-05-14 ENCOUNTER — Other Ambulatory Visit (HOSPITAL_BASED_OUTPATIENT_CLINIC_OR_DEPARTMENT_OTHER): Payer: Self-pay

## 2023-05-14 MED ORDER — OSCIMIN 0.125 MG SL SUBL
SUBLINGUAL_TABLET | SUBLINGUAL | 2 refills | Status: AC
  Filled 2023-05-14: qty 90, 30d supply, fill #0

## 2023-05-14 NOTE — Telephone Encounter (Signed)
Called patient to schedule 6 week follow up appointment. Appointment made on 3.19.25 at 10 am with Dr.Hochrein.

## 2023-05-15 ENCOUNTER — Other Ambulatory Visit (HOSPITAL_BASED_OUTPATIENT_CLINIC_OR_DEPARTMENT_OTHER): Payer: Self-pay

## 2023-06-03 DIAGNOSIS — R002 Palpitations: Secondary | ICD-10-CM | POA: Diagnosis not present

## 2023-06-12 ENCOUNTER — Telehealth: Payer: Self-pay | Admitting: *Deleted

## 2023-06-12 NOTE — Telephone Encounter (Signed)
-----   Message from Lynwood Schilling sent at 06/08/2023  1:22 PM EST ----- There was no atrial fib.  No sustained arrhythmias.  She has some SVT but she likely would not want treatment of these per my note.  If she is feeling a lot of palpitations and wants to try a change in meds for symptomatic treatment please tell her to let me know.

## 2023-06-12 NOTE — Telephone Encounter (Signed)
 Per DPR.  Left detailed message  of results  of event monitor.   Per Dr Antoine Poche if patient is symptomatic and would like change in medication for frequent palp. Please contact  Dr Antoine Poche  otherwise keep appointment in March  2025

## 2023-06-28 ENCOUNTER — Other Ambulatory Visit (HOSPITAL_BASED_OUTPATIENT_CLINIC_OR_DEPARTMENT_OTHER): Payer: Self-pay

## 2023-07-02 ENCOUNTER — Other Ambulatory Visit (HOSPITAL_BASED_OUTPATIENT_CLINIC_OR_DEPARTMENT_OTHER): Payer: Self-pay

## 2023-08-02 ENCOUNTER — Other Ambulatory Visit (HOSPITAL_COMMUNITY): Payer: Self-pay

## 2023-08-20 NOTE — Progress Notes (Unsigned)
  Cardiology Office Note:   Date:  08/22/2023  ID:  Julia Singleton, DOB 11/10/1945, MRN [id number] PCP: Creola Corn, MD  [facility] Providers Cardiologist:  Rollene Rotunda, MD {  History of Present Illness:   Julia Singleton is a 78 y.o. female with a history of palpitations and IBS who is followed y Dr. Antoine Poche and presents today for follow-up of palpitations.   The patient was referred in 02/2022 for further evaluation of palpitations. An event monitor was ordered for further evaluation and showed underlying sinus rhythm with one 5 beat run of NSVT but no significant arrhythmias. Her BP was noted to be mildly elevated but patient reported that it was usually well controlled so she was not started on any medications for this.  I saw her in Dec and she had atrial fib on her Watch.   A 14 day monitor did not demonstrate atrial fib.    She she has occasional skipped beats but she is not noticing any sustained rapid beats and she has not had any high rate alarms on her watch.  The patient denies any new symptoms such as chest discomfort, neck or arm discomfort. There has been no new shortness of breath, PND or orthopnea. There have been no reported palpitations, presyncope or syncope.    ROS: As stated in the HPI and negative for all other systems.  Studies Reviewed:    EKG:   NA  Risk Assessment/Calculations:              Physical Exam:   VS:  BP 120/76   Pulse 83   Ht 5' 5.5" (1.664 m)   Wt 117 lb 3.2 oz (53.2 kg)   SpO2 98%   BMI 19.21 kg/m    Wt Readings from Last 3 Encounters:  08/22/23 117 lb 3.2 oz (53.2 kg)  05/08/23 117 lb (53.1 kg)  05/07/23 114 lb 10.2 oz (52 kg)     GEN: Well nourished, well developed in no acute distress NECK: No JVD; No carotid bruits CARDIAC: RRR, no murmurs, rubs, gallops RESPIRATORY:  Clear to auscultation without rales, wheezing or rhonchi  ABDOMEN: Soft, non-tender, non-distended EXTREMITIES:  No edema; No deformity   ASSESSMENT  AND PLAN:   Palpitations: I did not pick up any atrial fibs on her monitor that she wore for 2 weeks.  She has had no high rates on her Watch.  I think the burden of any atrial fibrillation would be very low.  We had a long conversation about this.  I think the risk-benefit of anticoagulation does not support chronic use and she does not need ablation.  She will keep an eye on this with her wearable and with symptoms.  No change in therapy or further testing is indicated.    Hypertension: Her blood pressure is slightly elevated by her diarrhea.  Emina have her start amlodipine 2.5 mg daily and she will keep a diary and follow-up with her primary provider.     Follow up with me as needed.  Signed, Rollene Rotunda, MD

## 2023-08-22 ENCOUNTER — Other Ambulatory Visit (HOSPITAL_BASED_OUTPATIENT_CLINIC_OR_DEPARTMENT_OTHER): Payer: Self-pay

## 2023-08-22 ENCOUNTER — Other Ambulatory Visit: Payer: Self-pay

## 2023-08-22 ENCOUNTER — Encounter: Payer: Self-pay | Admitting: Cardiology

## 2023-08-22 ENCOUNTER — Ambulatory Visit: Payer: Medicare HMO | Attending: Cardiology | Admitting: Cardiology

## 2023-08-22 ENCOUNTER — Other Ambulatory Visit (HOSPITAL_COMMUNITY): Payer: Self-pay

## 2023-08-22 VITALS — BP 120/76 | HR 83 | Ht 65.5 in | Wt 117.2 lb

## 2023-08-22 DIAGNOSIS — I10 Essential (primary) hypertension: Secondary | ICD-10-CM

## 2023-08-22 DIAGNOSIS — R002 Palpitations: Secondary | ICD-10-CM | POA: Diagnosis not present

## 2023-08-22 MED ORDER — AMLODIPINE BESYLATE 2.5 MG PO TABS: 2.5000 mg | ORAL_TABLET | Freq: Every day | ORAL | 3 refills | Status: AC

## 2023-08-22 MED ORDER — AMLODIPINE BESYLATE 2.5 MG PO TABS
2.5000 mg | ORAL_TABLET | Freq: Every day | ORAL | 3 refills | Status: DC
  Filled 2023-08-22: qty 90, 90d supply, fill #0

## 2023-08-22 NOTE — Patient Instructions (Signed)
 Medication Instructions:  Start Amlodipine 2.5 mg by mouth daily. *If you need a refill on your cardiac medications before your next appointment, please call your pharmacy*  Follow-Up: At [facility], you and your health needs are our priority.  As part of our continuing mission to provide you with exceptional heart care, we have created designated Provider Care Teams.  These Care Teams include your primary Cardiologist (physician) and Advanced Practice Providers (APPs -  Physician Assistants and Nurse Practitioners) who all work together to provide you with the care you need, when you need it.  We recommend signing up for the patient portal called "MyChart".  Sign up information is provided on this After Visit Summary.  MyChart is used to connect with patients for Virtual Visits (Telemedicine).  Patients are able to view lab/test results, encounter notes, upcoming appointments, etc.  Non-urgent messages can be sent to your provider as well.   To learn more about what you can do with MyChart, go to [email or web address].    Your next appointment:    As needed.   Provider:   Rollene Rotunda, MD     Other Instructions

## 2023-09-10 ENCOUNTER — Encounter: Payer: Self-pay | Admitting: Cardiology

## 2023-09-17 ENCOUNTER — Other Ambulatory Visit (HOSPITAL_BASED_OUTPATIENT_CLINIC_OR_DEPARTMENT_OTHER): Payer: Self-pay

## 2023-09-17 MED ORDER — ZOSTER VAC RECOMB ADJUVANTED 50 MCG/0.5ML IM SUSR
0.5000 mL | Freq: Once | INTRAMUSCULAR | 1 refills | Status: AC
  Filled 2023-09-17: qty 0.5, 1d supply, fill #0
  Filled 2024-02-18: qty 0.5, 1d supply, fill #1

## 2023-09-28 ENCOUNTER — Other Ambulatory Visit: Payer: Self-pay

## 2023-09-28 ENCOUNTER — Encounter: Payer: Self-pay | Admitting: Pharmacist

## 2023-09-28 ENCOUNTER — Other Ambulatory Visit (HOSPITAL_COMMUNITY): Payer: Self-pay

## 2023-10-01 ENCOUNTER — Other Ambulatory Visit: Payer: Self-pay

## 2023-10-02 ENCOUNTER — Other Ambulatory Visit (HOSPITAL_BASED_OUTPATIENT_CLINIC_OR_DEPARTMENT_OTHER): Payer: Self-pay | Admitting: Internal Medicine

## 2023-10-02 DIAGNOSIS — Z1231 Encounter for screening mammogram for malignant neoplasm of breast: Secondary | ICD-10-CM

## 2023-10-04 DIAGNOSIS — R002 Palpitations: Secondary | ICD-10-CM | POA: Diagnosis not present

## 2023-10-04 DIAGNOSIS — R3121 Asymptomatic microscopic hematuria: Secondary | ICD-10-CM | POA: Diagnosis not present

## 2023-10-04 DIAGNOSIS — E871 Hypo-osmolality and hyponatremia: Secondary | ICD-10-CM | POA: Diagnosis not present

## 2023-10-04 DIAGNOSIS — D3500 Benign neoplasm of unspecified adrenal gland: Secondary | ICD-10-CM | POA: Diagnosis not present

## 2023-10-04 DIAGNOSIS — K589 Irritable bowel syndrome without diarrhea: Secondary | ICD-10-CM | POA: Diagnosis not present

## 2023-10-04 DIAGNOSIS — K5790 Diverticulosis of intestine, part unspecified, without perforation or abscess without bleeding: Secondary | ICD-10-CM | POA: Diagnosis not present

## 2023-10-04 DIAGNOSIS — F329 Major depressive disorder, single episode, unspecified: Secondary | ICD-10-CM | POA: Diagnosis not present

## 2023-10-04 DIAGNOSIS — R946 Abnormal results of thyroid function studies: Secondary | ICD-10-CM | POA: Diagnosis not present

## 2023-10-04 DIAGNOSIS — K219 Gastro-esophageal reflux disease without esophagitis: Secondary | ICD-10-CM | POA: Diagnosis not present

## 2023-10-04 DIAGNOSIS — R3989 Other symptoms and signs involving the genitourinary system: Secondary | ICD-10-CM | POA: Diagnosis not present

## 2023-10-04 DIAGNOSIS — K6389 Other specified diseases of intestine: Secondary | ICD-10-CM | POA: Diagnosis not present

## 2023-10-04 DIAGNOSIS — R519 Headache, unspecified: Secondary | ICD-10-CM | POA: Diagnosis not present

## 2023-10-04 DIAGNOSIS — K5900 Constipation, unspecified: Secondary | ICD-10-CM | POA: Diagnosis not present

## 2023-10-04 DIAGNOSIS — R636 Underweight: Secondary | ICD-10-CM | POA: Diagnosis not present

## 2023-10-04 DIAGNOSIS — H18519 Endothelial corneal dystrophy, unspecified eye: Secondary | ICD-10-CM | POA: Diagnosis not present

## 2023-10-04 DIAGNOSIS — E042 Nontoxic multinodular goiter: Secondary | ICD-10-CM | POA: Diagnosis not present

## 2023-10-04 DIAGNOSIS — F341 Dysthymic disorder: Secondary | ICD-10-CM | POA: Diagnosis not present

## 2023-10-04 DIAGNOSIS — R413 Other amnesia: Secondary | ICD-10-CM | POA: Diagnosis not present

## 2023-10-09 ENCOUNTER — Ambulatory Visit (HOSPITAL_BASED_OUTPATIENT_CLINIC_OR_DEPARTMENT_OTHER)
Admission: RE | Admit: 2023-10-09 | Discharge: 2023-10-09 | Disposition: A | Discharge status: Home / Self Care | Source: Ambulatory Visit

## 2023-10-09 ENCOUNTER — Encounter (HOSPITAL_BASED_OUTPATIENT_CLINIC_OR_DEPARTMENT_OTHER): Payer: Self-pay | Admitting: Radiology

## 2023-10-09 DIAGNOSIS — Z1231 Encounter for screening mammogram for malignant neoplasm of breast: Secondary | ICD-10-CM | POA: Diagnosis not present

## 2023-10-16 ENCOUNTER — Encounter: Payer: Self-pay | Admitting: Cardiology

## 2023-12-04 DIAGNOSIS — R051 Acute cough: Secondary | ICD-10-CM | POA: Diagnosis not present

## 2023-12-04 DIAGNOSIS — J302 Other seasonal allergic rhinitis: Secondary | ICD-10-CM | POA: Diagnosis not present

## 2023-12-04 DIAGNOSIS — R0989 Other specified symptoms and signs involving the circulatory and respiratory systems: Secondary | ICD-10-CM | POA: Diagnosis not present

## 2023-12-04 DIAGNOSIS — K219 Gastro-esophageal reflux disease without esophagitis: Secondary | ICD-10-CM | POA: Diagnosis not present

## 2023-12-25 DIAGNOSIS — K219 Gastro-esophageal reflux disease without esophagitis: Secondary | ICD-10-CM | POA: Diagnosis not present

## 2023-12-25 DIAGNOSIS — R519 Headache, unspecified: Secondary | ICD-10-CM | POA: Diagnosis not present

## 2023-12-25 DIAGNOSIS — J302 Other seasonal allergic rhinitis: Secondary | ICD-10-CM | POA: Diagnosis not present

## 2023-12-25 DIAGNOSIS — R051 Acute cough: Secondary | ICD-10-CM | POA: Diagnosis not present

## 2023-12-25 DIAGNOSIS — R0981 Nasal congestion: Secondary | ICD-10-CM | POA: Diagnosis not present

## 2023-12-25 DIAGNOSIS — R0989 Other specified symptoms and signs involving the circulatory and respiratory systems: Secondary | ICD-10-CM | POA: Diagnosis not present

## 2023-12-25 DIAGNOSIS — Z1152 Encounter for screening for COVID-19: Secondary | ICD-10-CM | POA: Diagnosis not present

## 2023-12-26 ENCOUNTER — Other Ambulatory Visit: Payer: Self-pay | Admitting: Gastroenterology

## 2023-12-26 DIAGNOSIS — K580 Irritable bowel syndrome with diarrhea: Secondary | ICD-10-CM | POA: Diagnosis not present

## 2023-12-26 DIAGNOSIS — Q453 Other congenital malformations of pancreas and pancreatic duct: Secondary | ICD-10-CM | POA: Diagnosis not present

## 2023-12-26 DIAGNOSIS — R109 Unspecified abdominal pain: Secondary | ICD-10-CM

## 2023-12-26 DIAGNOSIS — R140 Abdominal distension (gaseous): Secondary | ICD-10-CM | POA: Diagnosis not present

## 2024-01-02 ENCOUNTER — Ambulatory Visit
Admission: RE | Admit: 2024-01-02 | Discharge: 2024-01-02 | Disposition: A | Discharge status: Home / Self Care | Source: Ambulatory Visit | Attending: Gastroenterology | Admitting: Gastroenterology

## 2024-01-02 DIAGNOSIS — Q453 Other congenital malformations of pancreas and pancreatic duct: Secondary | ICD-10-CM

## 2024-01-02 DIAGNOSIS — R109 Unspecified abdominal pain: Secondary | ICD-10-CM | POA: Diagnosis not present

## 2024-01-02 MED ORDER — GADOPICLENOL 0.5 MMOL/ML IV SOLN
5.0000 mL | Freq: Once | INTRAVENOUS | Status: AC | PRN
  Administered 2024-01-02: 5 mL via INTRAVENOUS

## 2024-01-07 ENCOUNTER — Encounter: Payer: Self-pay | Admitting: Cardiology

## 2024-01-07 DIAGNOSIS — I517 Cardiomegaly: Secondary | ICD-10-CM

## 2024-01-08 NOTE — Addendum Note (Signed)
 Addended by: Jamaiya Tunnell N on: 01/08/2024 10:16 AM   Modules accepted: Orders

## 2024-01-23 DIAGNOSIS — H52203 Unspecified astigmatism, bilateral: Secondary | ICD-10-CM | POA: Diagnosis not present

## 2024-01-23 DIAGNOSIS — H43813 Vitreous degeneration, bilateral: Secondary | ICD-10-CM | POA: Diagnosis not present

## 2024-01-23 DIAGNOSIS — H35363 Drusen (degenerative) of macula, bilateral: Secondary | ICD-10-CM | POA: Diagnosis not present

## 2024-01-28 DIAGNOSIS — J302 Other seasonal allergic rhinitis: Secondary | ICD-10-CM | POA: Diagnosis not present

## 2024-01-28 DIAGNOSIS — J392 Other diseases of pharynx: Secondary | ICD-10-CM | POA: Diagnosis not present

## 2024-01-28 DIAGNOSIS — J029 Acute pharyngitis, unspecified: Secondary | ICD-10-CM | POA: Diagnosis not present

## 2024-01-28 DIAGNOSIS — K219 Gastro-esophageal reflux disease without esophagitis: Secondary | ICD-10-CM | POA: Diagnosis not present

## 2024-01-28 DIAGNOSIS — R0989 Other specified symptoms and signs involving the circulatory and respiratory systems: Secondary | ICD-10-CM | POA: Diagnosis not present

## 2024-02-07 ENCOUNTER — Ambulatory Visit (HOSPITAL_BASED_OUTPATIENT_CLINIC_OR_DEPARTMENT_OTHER)

## 2024-02-07 DIAGNOSIS — I517 Cardiomegaly: Secondary | ICD-10-CM

## 2024-02-10 LAB — ECHOCARDIOGRAM COMPLETE
Area-P 1/2: 3.21 cm2
S' Lateral: 3.41 cm

## 2024-02-13 ENCOUNTER — Ambulatory Visit: Payer: Self-pay | Admitting: Cardiology

## 2024-02-18 ENCOUNTER — Other Ambulatory Visit (HOSPITAL_BASED_OUTPATIENT_CLINIC_OR_DEPARTMENT_OTHER): Payer: Self-pay

## 2024-02-18 DIAGNOSIS — J358 Other chronic diseases of tonsils and adenoids: Secondary | ICD-10-CM | POA: Diagnosis not present

## 2024-02-18 DIAGNOSIS — J351 Hypertrophy of tonsils: Secondary | ICD-10-CM | POA: Diagnosis not present

## 2024-02-18 DIAGNOSIS — K219 Gastro-esophageal reflux disease without esophagitis: Secondary | ICD-10-CM | POA: Diagnosis not present

## 2024-02-29 DIAGNOSIS — J3081 Allergic rhinitis due to animal (cat) (dog) hair and dander: Secondary | ICD-10-CM | POA: Diagnosis not present

## 2024-02-29 DIAGNOSIS — J45909 Unspecified asthma, uncomplicated: Secondary | ICD-10-CM | POA: Diagnosis not present

## 2024-02-29 DIAGNOSIS — K219 Gastro-esophageal reflux disease without esophagitis: Secondary | ICD-10-CM | POA: Diagnosis not present

## 2024-02-29 DIAGNOSIS — H1045 Other chronic allergic conjunctivitis: Secondary | ICD-10-CM | POA: Diagnosis not present

## 2024-02-29 DIAGNOSIS — J45998 Other asthma: Secondary | ICD-10-CM | POA: Diagnosis not present

## 2024-02-29 DIAGNOSIS — J4520 Mild intermittent asthma, uncomplicated: Secondary | ICD-10-CM | POA: Diagnosis not present

## 2024-03-04 ENCOUNTER — Other Ambulatory Visit (HOSPITAL_BASED_OUTPATIENT_CLINIC_OR_DEPARTMENT_OTHER): Payer: Self-pay

## 2024-03-04 MED ORDER — MONTELUKAST SODIUM 10 MG PO TABS
10.0000 mg | ORAL_TABLET | Freq: Every day | ORAL | 2 refills | Status: AC
  Filled 2024-03-04 (×2): qty 30, 30d supply, fill #0

## 2024-03-05 ENCOUNTER — Other Ambulatory Visit (HOSPITAL_BASED_OUTPATIENT_CLINIC_OR_DEPARTMENT_OTHER): Payer: Self-pay

## 2024-03-20 DIAGNOSIS — Z1212 Encounter for screening for malignant neoplasm of rectum: Secondary | ICD-10-CM | POA: Diagnosis not present

## 2024-03-20 DIAGNOSIS — K219 Gastro-esophageal reflux disease without esophagitis: Secondary | ICD-10-CM | POA: Diagnosis not present

## 2024-03-20 DIAGNOSIS — R030 Elevated blood-pressure reading, without diagnosis of hypertension: Secondary | ICD-10-CM | POA: Diagnosis not present

## 2024-03-20 DIAGNOSIS — E042 Nontoxic multinodular goiter: Secondary | ICD-10-CM | POA: Diagnosis not present

## 2024-03-20 DIAGNOSIS — M810 Age-related osteoporosis without current pathological fracture: Secondary | ICD-10-CM | POA: Diagnosis not present

## 2024-03-27 DIAGNOSIS — K5900 Constipation, unspecified: Secondary | ICD-10-CM | POA: Diagnosis not present

## 2024-03-27 DIAGNOSIS — R82998 Other abnormal findings in urine: Secondary | ICD-10-CM | POA: Diagnosis not present

## 2024-03-27 DIAGNOSIS — Z1331 Encounter for screening for depression: Secondary | ICD-10-CM | POA: Diagnosis not present

## 2024-03-27 DIAGNOSIS — E042 Nontoxic multinodular goiter: Secondary | ICD-10-CM | POA: Diagnosis not present

## 2024-03-27 DIAGNOSIS — Z0000 Encounter for general adult medical examination without abnormal findings: Secondary | ICD-10-CM | POA: Diagnosis not present

## 2024-03-27 DIAGNOSIS — R413 Other amnesia: Secondary | ICD-10-CM | POA: Diagnosis not present

## 2024-03-27 DIAGNOSIS — K219 Gastro-esophageal reflux disease without esophagitis: Secondary | ICD-10-CM | POA: Diagnosis not present

## 2024-03-27 DIAGNOSIS — R636 Underweight: Secondary | ICD-10-CM | POA: Diagnosis not present

## 2024-03-27 DIAGNOSIS — F341 Dysthymic disorder: Secondary | ICD-10-CM | POA: Diagnosis not present

## 2024-03-27 DIAGNOSIS — F329 Major depressive disorder, single episode, unspecified: Secondary | ICD-10-CM | POA: Diagnosis not present

## 2024-03-27 DIAGNOSIS — Z1389 Encounter for screening for other disorder: Secondary | ICD-10-CM | POA: Diagnosis not present

## 2024-03-30 ENCOUNTER — Other Ambulatory Visit (HOSPITAL_BASED_OUTPATIENT_CLINIC_OR_DEPARTMENT_OTHER): Payer: Self-pay

## 2024-03-31 ENCOUNTER — Other Ambulatory Visit (HOSPITAL_BASED_OUTPATIENT_CLINIC_OR_DEPARTMENT_OTHER): Payer: Self-pay

## 2024-03-31 MED ORDER — RALOXIFENE HCL 60 MG PO TABS
60.0000 mg | ORAL_TABLET | Freq: Every day | ORAL | 3 refills | Status: AC
  Filled 2024-03-31: qty 90, 90d supply, fill #0
  Filled 2024-06-24: qty 90, 90d supply, fill #1

## 2024-04-10 DIAGNOSIS — Z1211 Encounter for screening for malignant neoplasm of colon: Secondary | ICD-10-CM | POA: Diagnosis not present
